# Patient Record
Sex: Female | Born: 1945 | Race: Black or African American | Hispanic: No | Marital: Married | Smoking: Former smoker
Health system: Southern US, Community
[De-identification: ages and names within clinical notes are randomized; demographics above are authoritative.]

## PROBLEM LIST (undated history)

## (undated) DIAGNOSIS — I2699 Other pulmonary embolism without acute cor pulmonale: Secondary | ICD-10-CM

## (undated) DIAGNOSIS — I10 Essential (primary) hypertension: Secondary | ICD-10-CM

## (undated) DIAGNOSIS — M6740 Ganglion, unspecified site: Secondary | ICD-10-CM

## (undated) DIAGNOSIS — E785 Hyperlipidemia, unspecified: Secondary | ICD-10-CM

## (undated) DIAGNOSIS — M1990 Unspecified osteoarthritis, unspecified site: Secondary | ICD-10-CM

## (undated) DIAGNOSIS — C801 Malignant (primary) neoplasm, unspecified: Secondary | ICD-10-CM

## (undated) HISTORY — PX: TUBAL LIGATION: SHX77

## (undated) HISTORY — DX: Unspecified osteoarthritis, unspecified site: M19.90

## (undated) HISTORY — DX: Hyperlipidemia, unspecified: E78.5

## (undated) HISTORY — DX: Essential (primary) hypertension: I10

## (undated) HISTORY — PX: TONSILLECTOMY: SUR1361

---

## 2004-08-21 DIAGNOSIS — I2699 Other pulmonary embolism without acute cor pulmonale: Secondary | ICD-10-CM

## 2004-08-21 HISTORY — DX: Other pulmonary embolism without acute cor pulmonale: I26.99

## 2005-06-29 ENCOUNTER — Inpatient Hospital Stay (HOSPITAL_COMMUNITY): Admission: EM | Admit: 2005-06-29 | Discharge: 2005-07-05 | Payer: Self-pay | Admitting: Emergency Medicine

## 2006-02-05 ENCOUNTER — Ambulatory Visit (HOSPITAL_COMMUNITY): Admission: RE | Admit: 2006-02-05 | Discharge: 2006-02-05 | Payer: Self-pay | Admitting: *Deleted

## 2006-02-05 ENCOUNTER — Encounter (INDEPENDENT_AMBULATORY_CARE_PROVIDER_SITE_OTHER): Payer: Self-pay | Admitting: *Deleted

## 2008-11-26 ENCOUNTER — Other Ambulatory Visit: Admission: RE | Admit: 2008-11-26 | Discharge: 2008-11-26 | Payer: Self-pay | Admitting: Internal Medicine

## 2011-01-06 NOTE — Op Note (Signed)
NAMEMarland Nunez  [name], [name]                ACCOUNT NO.:  [id number]   MEDICAL RECORD NO.:  [id number]          PATIENT TYPE:  AMB   LOCATION:  ENDO                         FACILITY:  [facility]   PHYSICIAN:  Georgiana Spinner, M.D.    DATE OF BIRTH:  April 03, 1946   DATE OF PROCEDURE:  02/05/2006  DATE OF DISCHARGE:                                 OPERATIVE REPORT   PROCEDURE:  Colonoscopy.   ENDOSCOPIST:  Georgiana Spinner, M.D.   INDICATIONS:  Colon polyp.   ANESTHESIA:  Fentanyl 25 mcg, Versed 2 mg.   DESCRIPTION OF PROCEDURE:  With the patient mildly sedated in the left  lateral decubitus position, the Olympus videoscopic colonoscope was inserted  into the rectum and passed under direct vision to cecum identified by crow's  foot of the cecum and ileocecal valve both, of which were photographed.  From this point, the colonoscope was slowly withdrawn taking circumferential  views of the colonic mucosa stopping in the descending colon where a polyp  was seen, photographed and removed using snare cautery technique with  setting of 20/200 blended current.  We next stopped in the rectum which  appeared normal indirect and showed a polyp on retroflexed view that was  removed using hot biopsy forceps technique, setting again of 20/220 current.  The endoscope was straightened and withdrawn.  The patient's vital signs and  pulse oximeter remained stable.  The patient tolerated the procedure well  without apparent complications.   FINDINGS:  Hemorrhoids and two small polyps as described in the descending  colon and rectum.   PLAN:  Await biopsy reports.  The patient will call me for results and  follow-up with me as an outpatient.           ______________________________  Georgiana Spinner, M.D.     GMO/MEDQ  D:  02/05/2006  T:  02/06/2006  Job:  [id number]

## 2011-01-06 NOTE — H&P (Signed)
NAMEMarland Nunez  [name], Deborah Nunez                ACCOUNT NO.:  [id number]   MEDICAL RECORD NO.:  [id number]          PATIENT TYPE:  EMS   LOCATION:  MAJO                         FACILITY:  [facility]   PHYSICIAN:  Lonia Blood, M.D.DATE OF BIRTH:  10-28-1945   DATE OF ADMISSION:  06/29/2005  DATE OF DISCHARGE:                                HISTORY & PHYSICAL   CHIEF COMPLAINT:  Chest pain with shortness of breath.   HISTORY OF PRESENT ILLNESS:  [name] is a 65 year old female with  relatively benign past medical history.  She is a very active individual.  She has been on Premarin for many years for postmenopausal symptom  management.  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  While there, she went on multiple  walks.  It was then that she began to first experience pain in her chest  accompanied by some reflux symptoms.  She treated her symptoms with  Mylanta/over-the-counter antacids and did not think more of them.  She then  returned to town.  Since that time, she has exercised many times including  walking on a treadmill in the gym.  She has noted significant dyspnea on  exertion to which she is not accustomed.  She also noticed that she became  fatigued much earlier when trying to exercise.  She then began to experience  recurrence of her chest symptoms including tightness as well as a sharp pain  located in the mid-central chest and then also in the periphery.  As  symptoms began to worsen, she became more concerned.  The morning of this  admission, she awoke to experience the pain.  This time the pain did not go  away and continued throughout the day.  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  The patient completed her full day of work and then  presented to the ER for evaluation.   In the emergency room, the patient had a  D-dimer assessed which was noted to  be positive.  She then underwent a CT scan of the chest which revealed  multiple bilateral small pulmonary emboli.   At the time of my admission, I find a patient who is sleeping comfortably on  the hospital gurney in the emergency room.  She presently has no complaints  of chest pain or shortness of breath.  There are no palpitations.  She has  had no difficulty with swelling in her legs whatsoever and no cramping of  the calves or thighs.   REVIEW OF SYSTEMS:  Comprehensive review of systems is unremarkable with the  exception of history of present illness noted above.   PAST MEDICAL HISTORY:  1.  Status post 2 uneventful vaginal deliveries.  2.  Status post bilateral tubal ligation.   OUTPATIENT MEDICATIONS:  1.  Premarin 0.625 mg p.o. daily for multiple years.  2.  Zyrtec 10 mg p.o. daily.   ALLERGIES:  No known drug allergies.  FAMILY HISTORY:  The patient's mother is alive, but has a history of  coronary artery disease, late-onset CVA and a possible history of a blood  clot in the past.  The patient's father passed away from lung cancer.  The  patient has a son who has thyroid cancer in his 30s, but no significant  family history of multiple blood clots to her knowledge.   SOCIAL HISTORY:  The patient is married.  She occasionally partakes of  alcohol.  She does not use illicit drugs.  She does not smoke.  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  She has 2  children.   DATA REVIEW:  White count is elevated at 11.9, platelets are 408,000,  hemoglobin is 13.7, MCV is 99.  Electrolytes are balanced.  BUN is 7.  Creatinine is 0.8.  LFTs are normal.  Lipase is normal.  D-dimer is elevated  at 1.08.  Cardiac point-of-care markers are negative.   Chest x-ray reveals no acute disease.   Twelve-lead EKG reveals 81 beats per minute, but is normal sinus rhythm and  has no acute ST or T wave change.   CT  scan reveals multiple bilateral small pulmonary emboli.   PHYSICAL EXAMINATION:  VITAL SIGNS:  Temperature -- afebrile, blood pressure  120/70, heart rate 72, respiratory rate 18, SATS 90% or greater on 2-L nasal  cannula.  GENERAL:  Well-developed, well-nourished female in no acute respiratory  distress.  LUNGS:  Clear to auscultation bilaterally without wheezes or rhonchi.  CARDIOVASCULAR:  Regular rate and rhythm without murmur, gallop or rub with  normal S1 and S2.  ABDOMEN:  Nontender, non-distended, soft.  Bowel sounds present.  No  hepatosplenomegaly.  No rebound.  No ascites.  EXTREMITIES:  No significant cyanosis, clubbing or edema in bilateral lower  extremities.  Negative Homans sign bilaterally.  No significant focal  erythema and no cords.  NEUROLOGIC:  Nonfocal neurologic exam.  NECK:  No JVD.  No lymphadenopathy.  No thyromegaly.   IMPRESSION AND PLAN:  1.  Multiple bilateral pulmonary emboli -- the patient is being placed on      heparin as I dictate this transcription.  Coumadin will be initiated      this evening.  The patient will be committed to a 26-month course for her      bilateral pulmonary emboli.  She understands that she will be committed      to intravenous heparin until Coumadin is therapeutic for 36-48 hours.      As for predisposing issues/risk factors, the only standout is the      Premarin dosing itself.  The patient does not smoke.  She did take a 4-      1/2 hour car ride just prior to the onset of her symptoms and this issue      could further have predisposed her to deep venous thrombosis formation.      I do not feel that a genetic cause is very likely in this patient, given      her age and relative lack of other involved family members.  I have      chosen not to assess laboratories at this time, but it certainly would      be prudent to do a complete workup at such time that she has completed     her anticoagulation therapy.  I have advised the  patient that I feel      hormone  replacement therapy is the most likely culprit predisposing her      to this clot and I have advised her that she should discontinue its use      lifelong.  2.  Hormone replacement therapy -- as detailed above, I have discussed the      risk of clot formation on hormone replacement therapy with the patient      and have advised her that she should discontinue its use immediately and      not consider using it again.      Lonia Blood, M.D.  Electronically Signed    JTM/MEDQ  D:  06/29/2005  T:  06/29/2005  Job:  [id number]

## 2011-01-06 NOTE — Op Note (Signed)
NAMEMarland Nunez  [name], [name]                ACCOUNT NO.:  [id number]   MEDICAL RECORD NO.:  [id number]          PATIENT TYPE:  AMB   LOCATION:  ENDO                         FACILITY:  [facility]   PHYSICIAN:  Georgiana Spinner, M.D.    DATE OF BIRTH:  1946-03-04   DATE OF PROCEDURE:  02/05/2006  DATE OF DISCHARGE:                                 OPERATIVE REPORT   PROCEDURE:  Upper endoscopy.   INDICATIONS:  GERD.   ANESTHESIA:  Fentanyl 50 mcg, Versed 5 mg.   PROCEDURE:  With the patient mildly sedated in left lateral decubitus  position, the Olympus videoscopic endoscope was inserted into the mouth,  passed under direct vision through the esophagus which appeared normal.  There was no evidence of Barrett's esophagus.  We entered into the stomach,  fundus, body, and antrum.  Duodenal bulb, second portion of the duodenum  appeared normal.  From this point, the endoscope was slowly withdrawn,  taking circumferential views of the duodenal mucosa until the endoscope had  been pulled back into the stomach, placed in retroflexion to view the  stomach from below.  Endoscope was straightened and withdrawn, taking  circumferential views of the remaining gastric and esophageal mucosa.  The  patient's vital signs and pulse oximetry remained stable.  The patient  tolerated the procedure well without apparent complications.   FINDINGS:  Unremarkable examination.   PLAN:  Proceed to colonoscopy.           ______________________________  Georgiana Spinner, M.D.     GMO/MEDQ  D:  02/05/2006  T:  02/06/2006  Job:  [id number]

## 2011-01-06 NOTE — Discharge Summary (Signed)
NAMEMarland Kitchen  Deborah Nunez, Deborah Nunez                ACCOUNT NO.:  [id number]   MEDICAL RECORD NO.:  [id number]          PATIENT TYPE:  INP   LOCATION:  3714                         FACILITY:  [facility]   PHYSICIAN:  Danae Chen, M.D.DATE OF BIRTH:  [birth year]   DATE OF ADMISSION:  06/28/2005  DATE OF DISCHARGE:  07/05/2005                                 DISCHARGE SUMMARY   PRIMARY CARE PHYSICIAN:  [facility] on [address].  She will  be following up with Dr. Lucas Mallow at [facility] as well.   DISCHARGE DIAGNOSIS:  Bilateral pulmonary emboli.   DISCHARGE MEDICATIONS:  1.  Coumadin 6 mg p.o. every day.  2.  The patient is to discontinue use of her Prempro hormonal supplement.   Follow up in one week's time to have a PT INR checked either at [facility] or at [facility].   No consults.   PROCEDURES:  1.  Spiral CT showing bilateral pulmonary emboli.  2.  Lower extremity venous Doppler showing no evidence of DVT, SVT, or Baker      cyst in either the left or right lower extremities.   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  Evaluation revealed  that she did have bilateral pulmonary emboli.  She had recently been on a  prolonged trip and had also been on hormone replacement therapy which could  have been factors contributing to the formation of her pulmonary emboli.  She was admitted, started on IV heparin as well as oral Coumadin.  By  hospital day #4, she was therapeutic on oral Coumadin.  She had a 12 to 24-  hour overlap of heparin plus her Coumadin prior to her discharge.  At the  time of discharge her INR is 2.2.   DISCHARGE PHYSICAL EXAMINATION:  VITAL SIGNS:  She is afebrile, vital signs  are stable.  LUNGS:  Clear.  HEART:  Rate is regular.  ABDOMEN:  Soft.  EXTREMITIES:  She has no peripheral edema.  NEUROLOGIC:  She is  alert and oriented.   CONDITION ON DISCHARGE:  Improved.   FOLLOWUP:  As above.   The patient may return to work on July 10, 2005, and as noted she is to  discontinue use of her hormone replacement therapy.      Danae Chen, M.D.  Electronically Signed     RLK/MEDQ  D:  07/05/2005  T:  07/05/2005  Job:  [id number]

## 2013-02-12 ENCOUNTER — Other Ambulatory Visit: Payer: Self-pay | Admitting: Internal Medicine

## 2013-02-25 ENCOUNTER — Ambulatory Visit
Admission: RE | Admit: 2013-02-25 | Discharge: 2013-02-25 | Disposition: A | Discharge status: Home / Self Care | Payer: BC Managed Care – PPO | Source: Ambulatory Visit | Attending: Internal Medicine | Admitting: Internal Medicine

## 2013-03-18 ENCOUNTER — Other Ambulatory Visit: Payer: Self-pay | Admitting: Gastroenterology

## 2014-05-21 ENCOUNTER — Other Ambulatory Visit: Payer: Self-pay | Admitting: Internal Medicine

## 2014-05-21 DIAGNOSIS — R591 Generalized enlarged lymph nodes: Secondary | ICD-10-CM

## 2014-05-27 ENCOUNTER — Ambulatory Visit
Admission: RE | Admit: 2014-05-27 | Discharge: 2014-05-27 | Disposition: A | Discharge status: Home / Self Care | Payer: Medicare Other | Source: Ambulatory Visit | Attending: Internal Medicine | Admitting: Internal Medicine

## 2014-05-27 DIAGNOSIS — R591 Generalized enlarged lymph nodes: Secondary | ICD-10-CM

## 2014-05-27 MED ORDER — IOHEXOL 300 MG/ML  SOLN
75.0000 mL | Freq: Once | INTRAMUSCULAR | Status: AC | PRN
  Administered 2014-05-27: 75 mL via INTRAVENOUS

## 2014-06-01 ENCOUNTER — Other Ambulatory Visit: Payer: Self-pay | Admitting: Internal Medicine

## 2014-06-01 DIAGNOSIS — D490 Neoplasm of unspecified behavior of digestive system: Secondary | ICD-10-CM

## 2014-06-01 DIAGNOSIS — R9389 Abnormal findings on diagnostic imaging of other specified body structures: Secondary | ICD-10-CM

## 2014-06-11 ENCOUNTER — Ambulatory Visit
Admission: RE | Admit: 2014-06-11 | Discharge: 2014-06-11 | Disposition: A | Discharge status: Home / Self Care | Payer: Medicare Other | Source: Ambulatory Visit | Attending: Internal Medicine | Admitting: Internal Medicine

## 2014-06-11 DIAGNOSIS — R9389 Abnormal findings on diagnostic imaging of other specified body structures: Secondary | ICD-10-CM

## 2014-06-11 DIAGNOSIS — D490 Neoplasm of unspecified behavior of digestive system: Secondary | ICD-10-CM

## 2014-06-11 MED ORDER — GADOBENATE DIMEGLUMINE 529 MG/ML IV SOLN
18.0000 mL | Freq: Once | INTRAVENOUS | Status: AC | PRN
  Administered 2014-06-11: 18 mL via INTRAVENOUS

## 2014-06-12 ENCOUNTER — Other Ambulatory Visit: Payer: Self-pay | Admitting: Internal Medicine

## 2014-06-12 DIAGNOSIS — E041 Nontoxic single thyroid nodule: Secondary | ICD-10-CM

## 2014-06-16 ENCOUNTER — Ambulatory Visit
Admission: RE | Admit: 2014-06-16 | Discharge: 2014-06-16 | Disposition: A | Discharge status: Home / Self Care | Payer: Medicare Other | Source: Ambulatory Visit | Attending: Internal Medicine | Admitting: Internal Medicine

## 2014-06-16 DIAGNOSIS — E041 Nontoxic single thyroid nodule: Secondary | ICD-10-CM

## 2015-06-23 ENCOUNTER — Other Ambulatory Visit: Payer: Self-pay | Admitting: Orthopedic Surgery

## 2015-07-09 ENCOUNTER — Encounter (HOSPITAL_BASED_OUTPATIENT_CLINIC_OR_DEPARTMENT_OTHER): Payer: Self-pay | Admitting: *Deleted

## 2015-07-20 ENCOUNTER — Ambulatory Visit (HOSPITAL_BASED_OUTPATIENT_CLINIC_OR_DEPARTMENT_OTHER): Payer: Medicare Other | Admitting: Anesthesiology

## 2015-07-20 ENCOUNTER — Ambulatory Visit (HOSPITAL_BASED_OUTPATIENT_CLINIC_OR_DEPARTMENT_OTHER)
Admission: RE | Admit: 2015-07-20 | Discharge: 2015-07-20 | Disposition: A | Discharge status: Home / Self Care | Payer: Medicare Other | Source: Ambulatory Visit | Attending: Orthopedic Surgery | Admitting: Orthopedic Surgery

## 2015-07-20 ENCOUNTER — Encounter (HOSPITAL_BASED_OUTPATIENT_CLINIC_OR_DEPARTMENT_OTHER)
Admission: RE | Disposition: A | Discharge status: Home / Self Care | Payer: Self-pay | Source: Ambulatory Visit | Attending: Orthopedic Surgery

## 2015-07-20 ENCOUNTER — Encounter (HOSPITAL_BASED_OUTPATIENT_CLINIC_OR_DEPARTMENT_OTHER): Payer: Self-pay | Admitting: Orthopedic Surgery

## 2015-07-20 DIAGNOSIS — M67441 Ganglion, right hand: Secondary | ICD-10-CM | POA: Diagnosis not present

## 2015-07-20 DIAGNOSIS — Z87891 Personal history of nicotine dependence: Secondary | ICD-10-CM | POA: Diagnosis not present

## 2015-07-20 DIAGNOSIS — I10 Essential (primary) hypertension: Secondary | ICD-10-CM | POA: Insufficient documentation

## 2015-07-20 DIAGNOSIS — R2231 Localized swelling, mass and lump, right upper limb: Secondary | ICD-10-CM | POA: Diagnosis present

## 2015-07-20 DIAGNOSIS — Z86711 Personal history of pulmonary embolism: Secondary | ICD-10-CM | POA: Diagnosis not present

## 2015-07-20 HISTORY — DX: Other pulmonary embolism without acute cor pulmonale: I26.99

## 2015-07-20 HISTORY — DX: Ganglion, unspecified site: M67.40

## 2015-07-20 HISTORY — PX: CYST EXCISION: SHX5701

## 2015-07-20 SURGERY — CYST REMOVAL
Anesthesia: General | Site: Finger | Laterality: Right

## 2015-07-20 MED ORDER — ONDANSETRON HCL 4 MG/2ML IJ SOLN
INTRAMUSCULAR | Status: DC | PRN
  Administered 2015-07-20: 4 mg via INTRAVENOUS

## 2015-07-20 MED ORDER — CEFAZOLIN SODIUM-DEXTROSE 2-3 GM-% IV SOLR: 2.0000 g | INTRAVENOUS | Status: DC

## 2015-07-20 MED ORDER — HYDROCODONE-ACETAMINOPHEN 5-325 MG PO TABS: 1.0000 | ORAL_TABLET | Freq: Four times a day (QID) | ORAL | Status: DC | PRN

## 2015-07-20 MED ORDER — MEPERIDINE HCL 25 MG/ML IJ SOLN: 6.2500 mg | INTRAMUSCULAR | Status: DC | PRN

## 2015-07-20 MED ORDER — CHLORHEXIDINE GLUCONATE 4 % EX LIQD: 60.0000 mL | Freq: Once | CUTANEOUS | Status: DC

## 2015-07-20 MED ORDER — PROPOFOL 10 MG/ML IV BOLUS
INTRAVENOUS | Status: DC | PRN
  Administered 2015-07-20: 50 mg via INTRAVENOUS
  Administered 2015-07-20: 200 mg via INTRAVENOUS

## 2015-07-20 MED ORDER — DEXAMETHASONE SODIUM PHOSPHATE 4 MG/ML IJ SOLN
INTRAMUSCULAR | Status: DC | PRN
  Administered 2015-07-20: 10 mg via INTRAVENOUS

## 2015-07-20 MED ORDER — MIDAZOLAM HCL 2 MG/2ML IJ SOLN
1.0000 mg | INTRAMUSCULAR | Status: DC | PRN
  Administered 2015-07-20: 2 mg via INTRAVENOUS

## 2015-07-20 MED ORDER — LIDOCAINE HCL (CARDIAC) 20 MG/ML IV SOLN
INTRAVENOUS | Status: AC
  Filled 2015-07-20: qty 5

## 2015-07-20 MED ORDER — LIDOCAINE HCL (CARDIAC) 20 MG/ML IV SOLN
INTRAVENOUS | Status: DC | PRN
  Administered 2015-07-20: 100 mg via INTRAVENOUS

## 2015-07-20 MED ORDER — FENTANYL CITRATE (PF) 100 MCG/2ML IJ SOLN
50.0000 ug | INTRAMUSCULAR | Status: DC | PRN
  Administered 2015-07-20: 100 ug via INTRAVENOUS

## 2015-07-20 MED ORDER — LACTATED RINGERS IV SOLN
INTRAVENOUS | Status: DC
  Administered 2015-07-20: 09:00:00 via INTRAVENOUS

## 2015-07-20 MED ORDER — PROPOFOL 10 MG/ML IV BOLUS
INTRAVENOUS | Status: AC
  Filled 2015-07-20: qty 20

## 2015-07-20 MED ORDER — 0.9 % SODIUM CHLORIDE (POUR BTL) OPTIME
TOPICAL | Status: DC | PRN
  Administered 2015-07-20: 100 mL

## 2015-07-20 MED ORDER — FENTANYL CITRATE (PF) 100 MCG/2ML IJ SOLN
INTRAMUSCULAR | Status: AC
  Filled 2015-07-20: qty 2

## 2015-07-20 MED ORDER — MIDAZOLAM HCL 2 MG/2ML IJ SOLN
INTRAMUSCULAR | Status: AC
  Filled 2015-07-20: qty 2

## 2015-07-20 MED ORDER — SUCCINYLCHOLINE CHLORIDE 20 MG/ML IJ SOLN
INTRAMUSCULAR | Status: AC
  Filled 2015-07-20: qty 1

## 2015-07-20 MED ORDER — ONDANSETRON HCL 4 MG/2ML IJ SOLN
INTRAMUSCULAR | Status: AC
  Filled 2015-07-20: qty 2

## 2015-07-20 MED ORDER — GLYCOPYRROLATE 0.2 MG/ML IJ SOLN: 0.2000 mg | Freq: Once | INTRAMUSCULAR | Status: DC | PRN

## 2015-07-20 MED ORDER — CEFAZOLIN SODIUM-DEXTROSE 2-3 GM-% IV SOLR
2.0000 g | INTRAVENOUS | Status: AC
  Administered 2015-07-20: 2 g via INTRAVENOUS

## 2015-07-20 MED ORDER — SCOPOLAMINE 1 MG/3DAYS TD PT72: 1.0000 | MEDICATED_PATCH | Freq: Once | TRANSDERMAL | Status: DC | PRN

## 2015-07-20 MED ORDER — FENTANYL CITRATE (PF) 100 MCG/2ML IJ SOLN: 25.0000 ug | INTRAMUSCULAR | Status: DC | PRN

## 2015-07-20 MED ORDER — PHENYLEPHRINE HCL 10 MG/ML IJ SOLN
INTRAMUSCULAR | Status: DC | PRN
  Administered 2015-07-20: 40 ug via INTRAVENOUS

## 2015-07-20 MED ORDER — BUPIVACAINE HCL (PF) 0.25 % IJ SOLN
INTRAMUSCULAR | Status: DC | PRN
  Administered 2015-07-20: 6 mL

## 2015-07-20 MED ORDER — DEXAMETHASONE SODIUM PHOSPHATE 10 MG/ML IJ SOLN
INTRAMUSCULAR | Status: AC
  Filled 2015-07-20: qty 1

## 2015-07-20 SURGICAL SUPPLY — 33 items
BLADE SURG 15 STRL LF DISP TIS (BLADE) ×1 IMPLANT
BLADE SURG 15 STRL SS (BLADE) ×3
BNDG CMPR 9X4 STRL LF SNTH (GAUZE/BANDAGES/DRESSINGS) ×1
BNDG COHESIVE 1X5 TAN STRL LF (GAUZE/BANDAGES/DRESSINGS) ×2 IMPLANT
BNDG ESMARK 4X9 LF (GAUZE/BANDAGES/DRESSINGS) ×2 IMPLANT
CHLORAPREP W/TINT 26ML (MISCELLANEOUS) ×3 IMPLANT
CORDS BIPOLAR (ELECTRODE) ×3 IMPLANT
COVER BACK TABLE 60X90IN (DRAPES) ×3 IMPLANT
COVER MAYO STAND STRL (DRAPES) ×3 IMPLANT
CUFF TOURNIQUET SINGLE 18IN (TOURNIQUET CUFF) ×2 IMPLANT
DRAPE EXTREMITY T 121X128X90 (DRAPE) ×3 IMPLANT
DRAPE SURG 17X23 STRL (DRAPES) ×3 IMPLANT
GAUZE SPONGE 4X4 12PLY STRL (GAUZE/BANDAGES/DRESSINGS) ×3 IMPLANT
GAUZE XEROFORM 1X8 LF (GAUZE/BANDAGES/DRESSINGS) ×3 IMPLANT
GLOVE BIOGEL PI IND STRL 7.0 (GLOVE) IMPLANT
GLOVE BIOGEL PI IND STRL 8.5 (GLOVE) ×1 IMPLANT
GLOVE BIOGEL PI INDICATOR 7.0 (GLOVE) ×4
GLOVE BIOGEL PI INDICATOR 8.5 (GLOVE) ×2
GLOVE ECLIPSE 6.5 STRL STRAW (GLOVE) ×2 IMPLANT
GLOVE SURG ORTHO 8.0 STRL STRW (GLOVE) ×3 IMPLANT
GOWN STRL REUS W/ TWL LRG LVL3 (GOWN DISPOSABLE) ×1 IMPLANT
GOWN STRL REUS W/TWL LRG LVL3 (GOWN DISPOSABLE) ×3
GOWN STRL REUS W/TWL XL LVL3 (GOWN DISPOSABLE) ×3 IMPLANT
NDL PRECISIONGLIDE 27X1.5 (NEEDLE) IMPLANT
NEEDLE PRECISIONGLIDE 27X1.5 (NEEDLE) ×3 IMPLANT
NS IRRIG 1000ML POUR BTL (IV SOLUTION) ×3 IMPLANT
PACK BASIN DAY SURGERY FS (CUSTOM PROCEDURE TRAY) ×3 IMPLANT
SPLINT FINGER 3.25 BULB 911905 (SOFTGOODS) ×2 IMPLANT
STOCKINETTE 4X48 STRL (DRAPES) ×3 IMPLANT
SUT ETHILON 4 0 PS 2 18 (SUTURE) ×3 IMPLANT
SYR BULB 3OZ (MISCELLANEOUS) ×3 IMPLANT
SYR CONTROL 10ML LL (SYRINGE) ×2 IMPLANT
TOWEL OR 17X24 6PK STRL BLUE (TOWEL DISPOSABLE) ×4 IMPLANT

## 2015-07-20 NOTE — Op Note (Signed)
Dictation Number [id number]

## 2015-07-20 NOTE — Discharge Instructions (Addendum)

## 2015-07-20 NOTE — Brief Op Note (Signed)
07/20/2015  10:48 AM  PATIENT:  Deborah Nunez  69 y.o. female  PRE-OPERATIVE DIAGNOSIS:  MUCOID TUMOR RIGHT INDEX FINGER   POST-OPERATIVE DIAGNOSIS:  MUCOID TUMOR RIGHT INDEX FINGER   PROCEDURE:  Procedure(s): EXCISION MUCOID CYST RIGHT INDEX  (Right)  SURGEON:  Surgeon(s) and Role:    * Daryll Brod, MD - Primary  PHYSICIAN ASSISTANT:   ASSISTANTS: none   ANESTHESIA:   local and general  EBL:  Total I/O In: 800 [I.V.:800] Out: -   BLOOD ADMINISTERED:none  DRAINS: none   LOCAL MEDICATIONS USED:  BUPIVICAINE   SPECIMEN:  Excision  DISPOSITION OF SPECIMEN:  PATHOLOGY  COUNTS:  YES  TOURNIQUET:   Total Tourniquet Time Documented: Forearm (Right) - 17 minutes Total: Forearm (Right) - 17 minutes   DICTATION: .Other Dictation: Dictation Number [id number]  PLAN OF CARE: Discharge to home after PACU  PATIENT DISPOSITION:  PACU - hemodynamically stable.

## 2015-07-20 NOTE — H&P (Signed)
  Deborah Nunez is a 69 year old right handed female complaining of a mass on her right index finger and right thumb. This has been there for approximately one year. It is grooving the nail distally. She is referred by Dr. Merrilee Seashore. She has no history of injury. There is no history of diabetes, thyroid problems, arthritis or gout. There is a family history of diabetes and thyroid problems. She is not complaining of any significant pain. She is complaining primarily of deformity of the nail.  PAST MEDICAL HISTORY:  She has no drug allergies. She is not currently taking any medications. She has had a tonsillectomy. She has a history of a pulmonary embolism for which she was on Coumadin from 2006 to 2008.   FAMILY MEDICAL HISTORY: Positive for high BP.  SOCIAL HISTORY:  She does not smoke. She drinks socially. She is widowed and a retired Pharmacist, hospital.  REVIEW OF SYSTEMS: Positive for glasses, blood in her urine, kidney disorder, otherwise negative 14 points.  Deborah Nunez is an 69 y.o. female.   Chief Complaint: DJD and cyst right index finger HPI: see above  Past Medical History  Diagnosis Date  . Pulmonary embolism ([place]) 2006  . Mucoid cyst of joint     right index    Past Surgical History  Procedure Laterality Date  . Tubal ligation    . Tonsillectomy      History reviewed. No pertinent family history. Social History:  reports that she has quit smoking. She does not have any smokeless tobacco history on file. She reports that she drinks alcohol. She reports that she does not use illicit drugs.  Allergies: No Known Allergies  No prescriptions prior to admission    No results found for this or any previous visit (from the past 48 hour(s)).  No results found.   Pertinent items are noted in HPI.  Height 5\' 6"  (1.676 m), weight 86.183 kg (190 lb).  General appearance: alert, cooperative and appears stated age Head: Normocephalic, without obvious abnormality Neck: no  JVD Resp: clear to auscultation bilaterally Cardio: regular rate and rhythm, S1, S2 normal, no murmur, click, rub or gallop GI: soft, non-tender; bowel sounds normal; no masses,  no organomegaly Extremities: djd right index finger DIP joint Pulses: 2+ and symmetric Skin: Skin color, texture, turgor normal. 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  We have discussed the possibility of surgical excision along with debridement of the joint. Pre, peri and post op care are discussed along with risks and complications. Patient is aware there is no guarantee with surgery, possibility of infection, injury to arteries, nerves, and tendons, incomplete relief and dystrophy. She desires proceeding and this is scheduled as an outpatient for excision mucoid tumor debridement DIP joint right index finger under regional anesthesia.  Deborah Nunez 07/20/2015, 5:25 AM

## 2015-07-20 NOTE — Anesthesia Postprocedure Evaluation (Signed)
Anesthesia Post Note  Patient: Hollis Burruel  Procedure(s) Performed: Procedure(s) (LRB): EXCISION MUCOID CYST RIGHT INDEX  (Right)  Patient location during evaluation: PACU Anesthesia Type: General Level of consciousness: awake and alert Pain management: pain level controlled Vital Signs Assessment: post-procedure vital signs reviewed and stable Respiratory status: spontaneous breathing, nonlabored ventilation, respiratory function stable and patient connected to nasal cannula oxygen Cardiovascular status: blood pressure returned to baseline and stable Postop Assessment: no signs of nausea or vomiting Anesthetic complications: no    Last Vitals:  Filed Vitals:   07/20/15 1030 07/20/15 1045  BP: 117/74 127/79  Pulse: 78 82  Temp: 36.4 C   Resp: 11 17    Last Pain:  Filed Vitals:   07/20/15 1048  PainSc: 0-No pain                 Anacarolina Evelyn A

## 2015-07-20 NOTE — Transfer of Care (Signed)
Immediate Anesthesia Transfer of Care Note  Patient: Deborah Nunez  Procedure(s) Performed: Procedure(s): EXCISION MUCOID CYST RIGHT INDEX  (Right)  Patient Location: PACU  Anesthesia Type:General  Level of Consciousness: awake, sedated and patient cooperative  Airway & Oxygen Therapy: Patient Spontanous Breathing and Patient connected to face mask oxygen  Post-op Assessment: Report given to RN and Post -op Vital signs reviewed and stable  Post vital signs: Reviewed and stable  Last Vitals:  Filed Vitals:   07/20/15 0903  BP: 136/83  Pulse: 82  Temp: 36.7 C  Resp: 18    Complications: No apparent anesthesia complications

## 2015-07-20 NOTE — Anesthesia Procedure Notes (Signed)
Procedure Name: LMA Insertion Date/Time: 07/20/2015 9:52 AM Performed by: Lyndee Leo Pre-anesthesia Checklist: Patient identified, Emergency Drugs available, Suction available and Patient being monitored Patient Re-evaluated:Patient Re-evaluated prior to inductionOxygen Delivery Method: Circle System Utilized Preoxygenation: Pre-oxygenation with 100% oxygen Intubation Type: IV induction Ventilation: Mask ventilation without difficulty LMA: LMA with gastric port inserted LMA Size: 3.0 Number of attempts: 1 Airway Equipment and Method: Bite block Placement Confirmation: positive ETCO2 Tube secured with: Tape Dental Injury: Teeth and Oropharynx as per pre-operative assessment

## 2015-07-20 NOTE — Anesthesia Preprocedure Evaluation (Signed)
Anesthesia Evaluation  Patient identified by MRN, date of birth, ID band Patient awake    Reviewed: Allergy & Precautions, NPO status , Patient's Chart, lab work & pertinent test results  Airway Mallampati: I  TM Distance: >3 FB Neck ROM: Full    Dental  (+) Teeth Intact, Dental Advisory Given   Pulmonary former smoker,    breath sounds clear to auscultation       Cardiovascular  Rhythm:Regular Rate:Normal     Neuro/Psych    GI/Hepatic   Endo/Other  Morbid obesity  Renal/GU      Musculoskeletal   Abdominal   Peds  Hematology   Anesthesia Other Findings   Reproductive/Obstetrics                             Anesthesia Physical Anesthesia Plan  ASA: I  Anesthesia Plan: General   Post-op Pain Management:    Induction: Intravenous  Airway Management Planned: LMA  Additional Equipment:   Intra-op Plan:   Post-operative Plan: Extubation in OR  Informed Consent: I have reviewed the patients History and Physical, chart, labs and discussed the procedure including the risks, benefits and alternatives for the proposed anesthesia with the patient or authorized representative who has indicated his/her understanding and acceptance.   Dental advisory given  Plan Discussed with: CRNA, Anesthesiologist and Surgeon  Anesthesia Plan Comments:         Anesthesia Quick Evaluation

## 2015-07-21 ENCOUNTER — Encounter (HOSPITAL_BASED_OUTPATIENT_CLINIC_OR_DEPARTMENT_OTHER): Payer: Self-pay | Admitting: Orthopedic Surgery

## 2015-07-21 NOTE — Op Note (Signed)
NAMEMarland Kitchen  Deborah Nunez, Deborah Nunez                ACCOUNT NO.:  [id number]  MEDICAL RECORD NO.:  [id number]  LOCATION:                                 FACILITY:  PHYSICIAN:  Daryll Brod, M.D.            DATE OF BIRTH:  DATE OF PROCEDURE:  07/20/2015 DATE OF DISCHARGE:                              OPERATIVE REPORT   PREOPERATIVE DIAGNOSES:  Mucoid tumor, distal interphalangeal joint; degenerative arthritis, right index finger.  POSTOPERATIVE DIAGNOSES:  Mucoid tumor, distal interphalangeal joint; degenerative arthritis, right index finger.  OPERATION:  Excision of mucoid cyst; debridement of distal interphalangeal joint, right index finger.  SURGEON:  Daryll Brod, M.D.  ANESTHESIA:  General with metacarpal block.  ANESTHESIOLOGIST:  Lorrene Reid, M.D.  HISTORY:  The patient is a 69 year old female with history of a mass grooving her nail plate distally of her right index finger.  X-rays revealed degenerative arthritis of the distal interphalangeal joint. She is elected to undergo surgical excision with debridement of the joint, in that the mass does transilluminate.  She is advised that there was no guarantee with the surgery; possibility of infection; recurrence of injury to arteries, nerves, tendons; incomplete relief of symptoms; dystrophy.  In the preoperative area, the patient was seen, the extremity marked by both patient and surgeon, and antibiotic given.  DESCRIPTION OF PROCEDURE:  The patient was brought to the operating room, where a general anesthetic was carried out without difficulty. She was prepped using ChloraPrep, supine position with the right arm free.  Time-out was taken.  The finger was blocked with 0.25% bupivacaine without epinephrine, approximately 6 mL was used.  A forearm IV regional had been also given along with her general anesthetic.  A curvilinear incision was made over the distal interphalangeal joint, carried down through the subcutaneous tissue.  Bleeders  were electrocauterized with bipolar.  The extensor tendon was noted.  The skin was lifted distally and the cyst immediately encountered.  With blunt and sharp dissection, this was dissected free, excised with a hemostatic rongeur and sent to Pathology.  The joint was opened laterally to the extensor tendon.  A synovectomy was performed dorsally along with debridement of osteophytes with the hemostatic rongeur.  This was all sent to Pathology.  The wound was copiously irrigated with saline and the skin was then closed with interrupted 4-0 nylon sutures. A sterile compressive dressing splint to the distal interphalangeal joint was applied.  On deflation of the tourniquet, all fingers were immediately pinked.  She was taken to the recovery room for observation in satisfactory condition.  She will be discharged to home to return to the [place] in 1 week, on [place].          ______________________________ Daryll Brod, M.D.     GK/MEDQ  D:  07/20/2015  T:  07/21/2015  Job:  [id number]

## 2016-03-22 IMAGING — CT CT NECK W/ CM
4 of 5 series · 16 of 33 positions shown, 19 images · IV contrast (75CC OMNI 300)
Comparison: No prior.

CLINICAL DATA: Lymphadenopathy.  Hoarseness.  Initial evaluation.

EXAM:
CT NECK WITH CONTRAST
TECHNIQUE: Multidetector CT imaging of the neck was performed using the
standard protocol following the bolus administration of intravenous
contrast.
CONTRAST:  75mL OMNIPAQUE IOHEXOL 300 MG/ML  SOLN

[Series 2: axial neck · axial · 0.43mm/px · z∈[+64,+156]mm · 3 of 93 slices shown]
[im 19/93  bone]
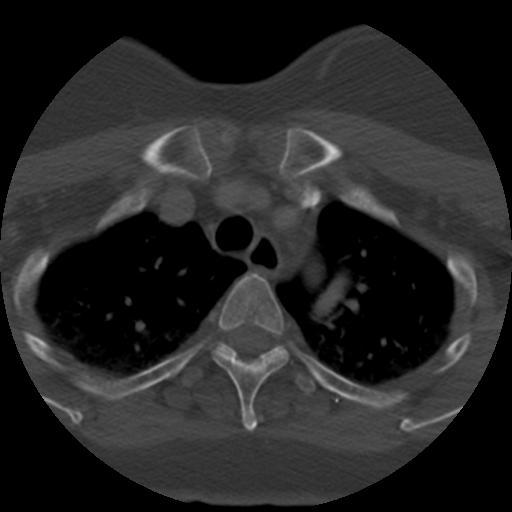
[im 37/93  bone]
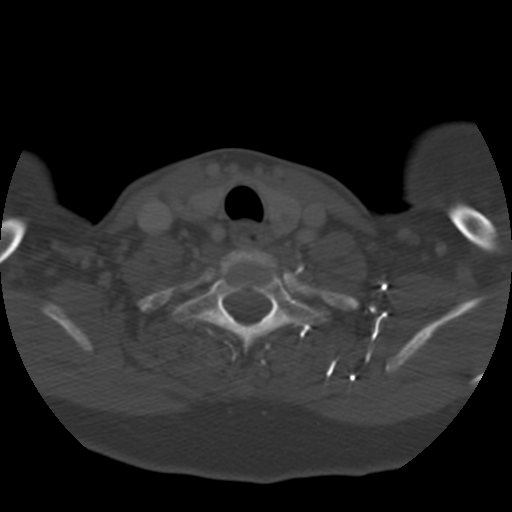
[im 56/93  bone]
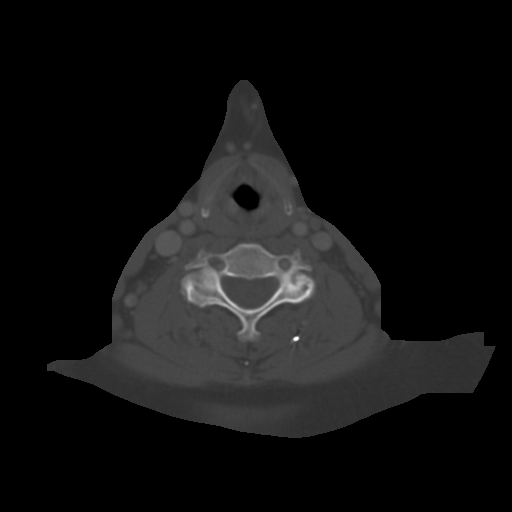

[Series 400: cor · coronal · 0.46mm/px · 3 of 109 slices shown]
[im 22/109  bone]
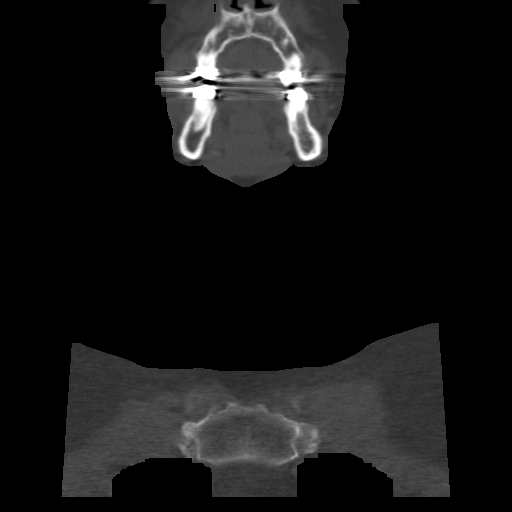
[im 44/109  bone]
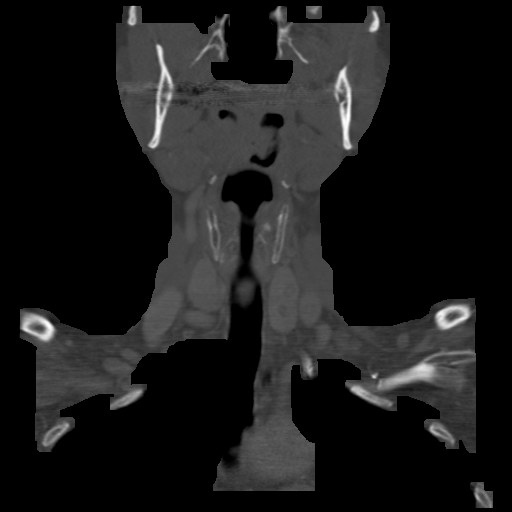
[im 65/109  bone]
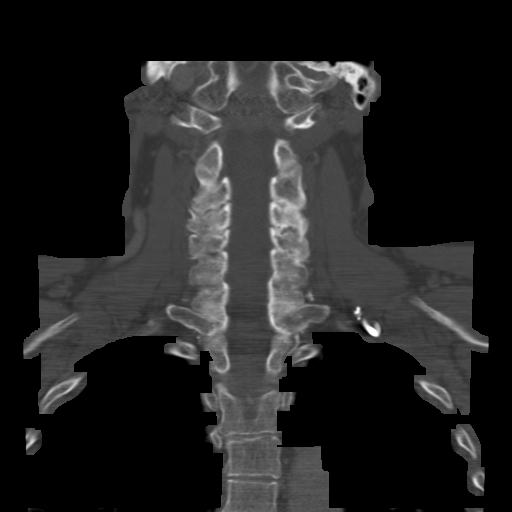

[Series 401: sag · sagittal · 0.46mm/px · 5 of 109 slices shown, 6 images]
[im 37/109  bone]
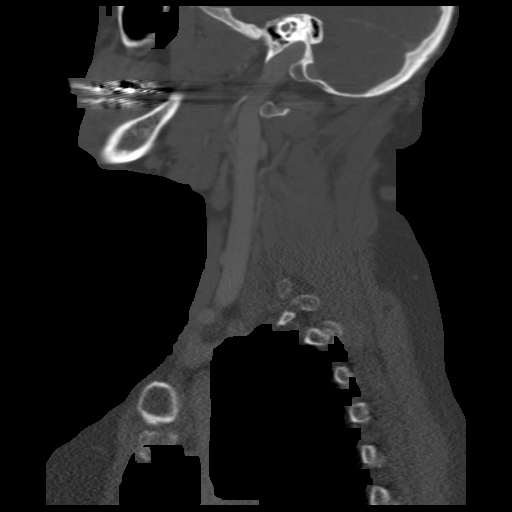
[im 46/109  bone]
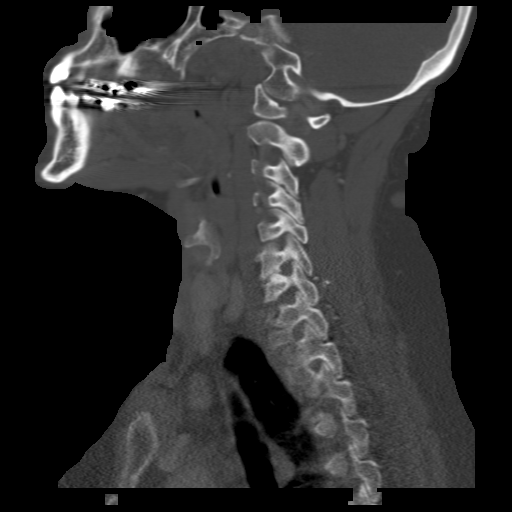
[im 55/109  soft-tissue]
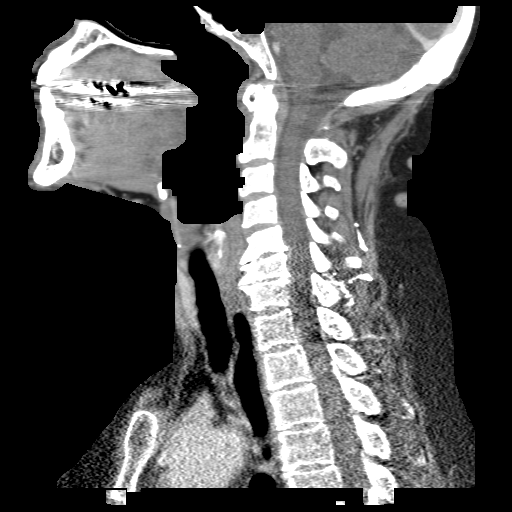
[im 55/109  bone]
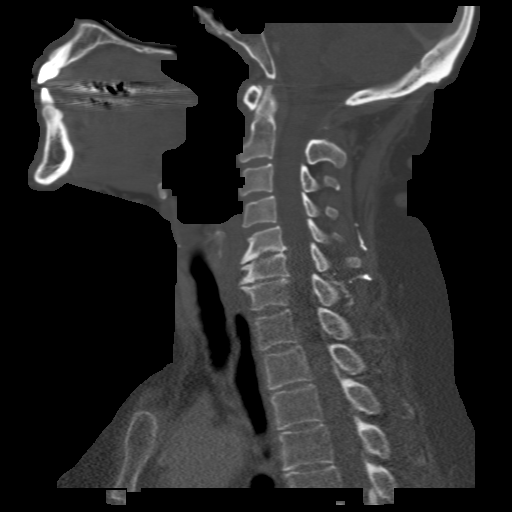
[im 64/109  bone]
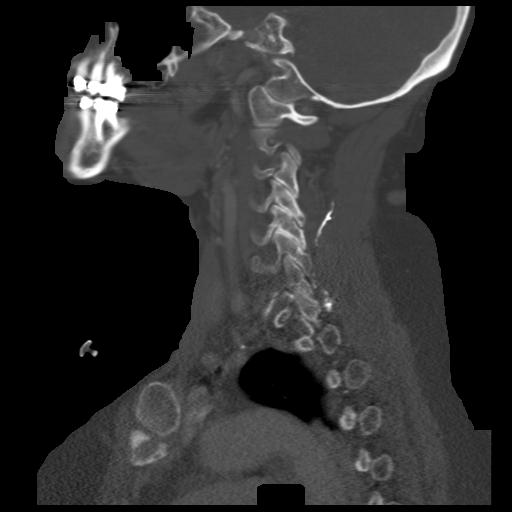
[im 73/109  bone]
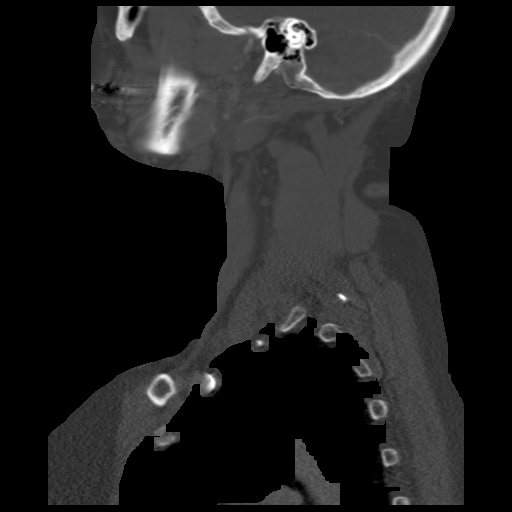

[Series 402: axial · axial · 0.43mm/px · z∈[+35,+194]mm · 5 of 123 slices shown, 7 images]
[im 21/123  soft-tissue]
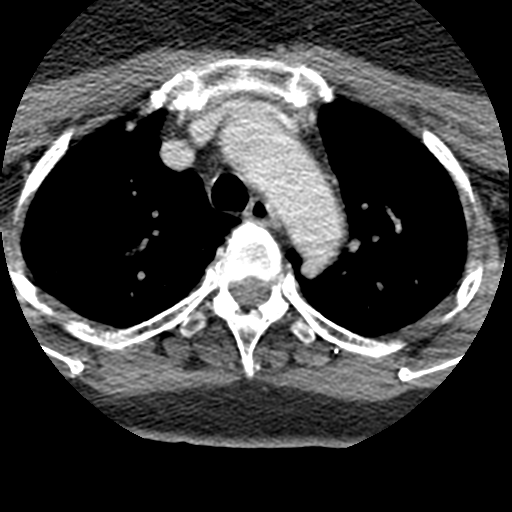
[im 21/123  bone]
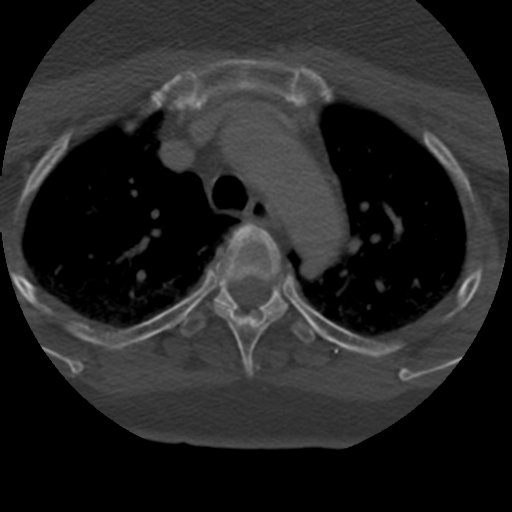
[im 41/123  bone]
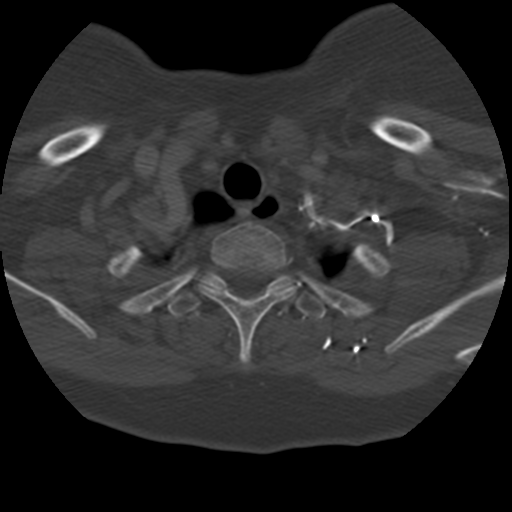
[im 62/123  bone]
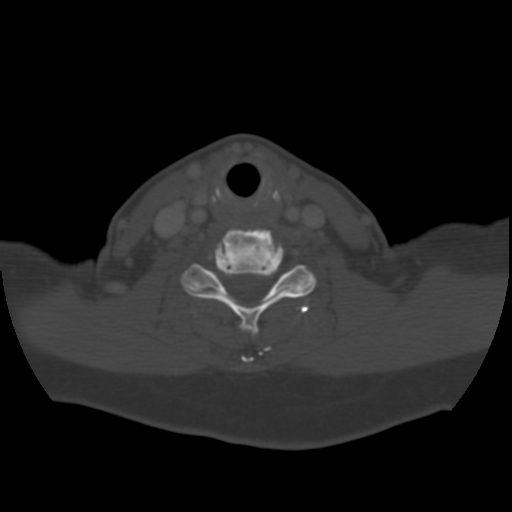
[im 82/123  bone]
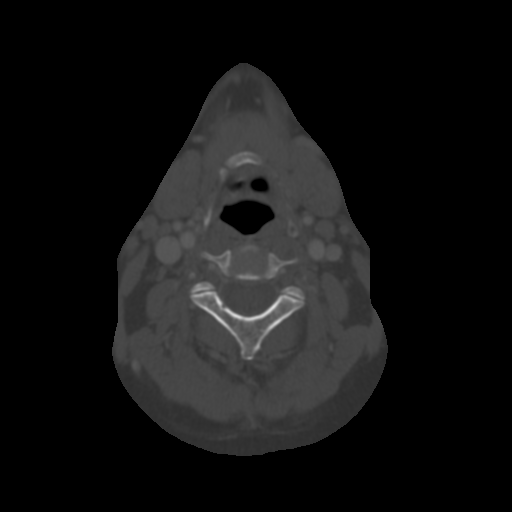
[im 102/123  soft-tissue]
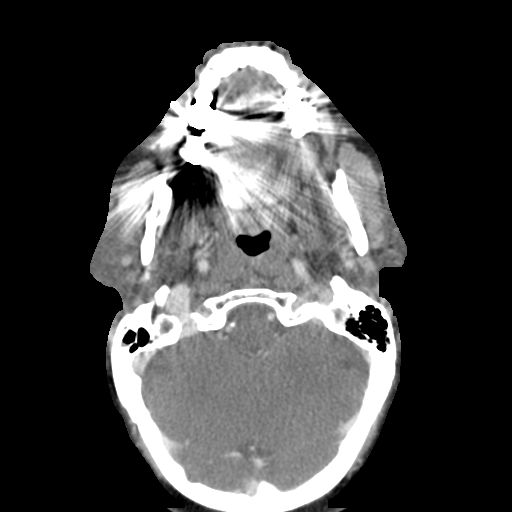
[im 102/123  bone]
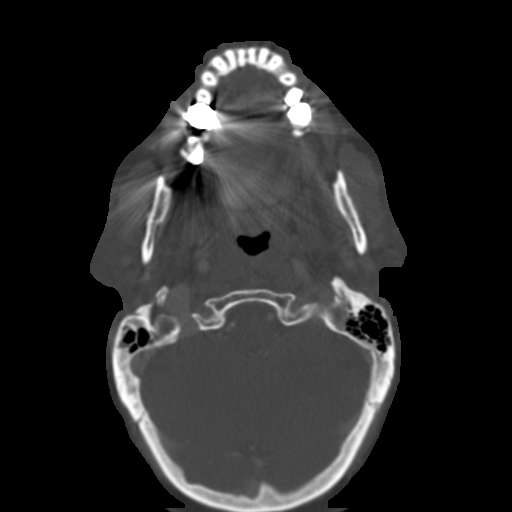

[16 of 33 positions shown; findings below may reference images not displayed]

FINDINGS: Visualized paranasal sinuses and mastoids are clear. Base of the
brain is unremarkable. Nasopharynx and oropharynx normal. Larynx
appears normal. Trachea is patent. 1.3 cm enhancing nodule in the
inferior tip of the right parotid gland. Cannot be certain if this
is vascular or true parotid lesion such as parotid tumor. To further
evaluate MRI is suggested. Submandibular glands are normal. Shotty
submandibular lymph nodes present. Bilateral shotty anterior and
posterior cervical lymph nodes are present. In the region of
palpable abnormality the largest lymph node measures 6 mm, image
number 32/series 2. No prominent cervical lymph nodes are present.
Tongue base and parapharyngeal spaces are normal. Tiny 3 mm nodules
noted both lobes of the thyroid, too small for evaluation. Upper
mediastinum is unremarkable. Vascular structures in the neck are
unremarkable. Supraclavicular regions are unremarkable. Mild passive
atelectasis upper lungs. Pulmonary apices otherwise clear. No acute
bony abnormality. Diffuse degenerative change.
IMPRESSION: 1. 1.3 cm enhancing nodule in the inferior tip of the right parotid
gland. To evaluate for parotid gland tumor gadolinium-enhanced
maxillofacial MRI suggested.

2. Shotty submandibular and bilateral anterior and posterior
cervical lymph nodes.

3.  Multinodular thyroid gland.

## 2019-11-21 DIAGNOSIS — M8589 Other specified disorders of bone density and structure, multiple sites: Secondary | ICD-10-CM | POA: Diagnosis not present

## 2019-11-21 DIAGNOSIS — Z1231 Encounter for screening mammogram for malignant neoplasm of breast: Secondary | ICD-10-CM | POA: Diagnosis not present

## 2019-12-11 DIAGNOSIS — N6311 Unspecified lump in the right breast, upper outer quadrant: Secondary | ICD-10-CM | POA: Diagnosis not present

## 2019-12-11 DIAGNOSIS — N6312 Unspecified lump in the right breast, upper inner quadrant: Secondary | ICD-10-CM | POA: Diagnosis not present

## 2019-12-11 DIAGNOSIS — R928 Other abnormal and inconclusive findings on diagnostic imaging of breast: Secondary | ICD-10-CM | POA: Diagnosis not present

## 2019-12-22 ENCOUNTER — Other Ambulatory Visit: Payer: Self-pay | Admitting: Radiology

## 2019-12-22 DIAGNOSIS — N6315 Unspecified lump in the right breast, overlapping quadrants: Secondary | ICD-10-CM | POA: Diagnosis not present

## 2019-12-22 DIAGNOSIS — C50811 Malignant neoplasm of overlapping sites of right female breast: Secondary | ICD-10-CM | POA: Diagnosis not present

## 2019-12-23 ENCOUNTER — Telehealth: Payer: Self-pay | Admitting: Oncology

## 2019-12-23 NOTE — Telephone Encounter (Signed)
Spoke to patient to confirm afternoon [facility] appointment for 5/12, packet emailed to patient

## 2019-12-25 ENCOUNTER — Encounter: Payer: Self-pay | Admitting: *Deleted

## 2019-12-25 DIAGNOSIS — Z170 Estrogen receptor positive status [ER+]: Secondary | ICD-10-CM

## 2019-12-25 DIAGNOSIS — C50411 Malignant neoplasm of upper-outer quadrant of right female breast: Secondary | ICD-10-CM

## 2019-12-30 NOTE — Progress Notes (Signed)
Deborah Nunez  [phone] [phone]     ID: Deborah Nunez DOB: Apr 05, 1946  MR#: [id number]  NGE#:[id number]  Patient Care Team: Merrilee Seashore, MD as PCP - General (Internal Medicine) Mauro Kaufmann, RN as Oncology Nurse Navigator Rockwell Germany, RN as Oncology Nurse Navigator Kyung Rudd, MD as Consulting Physician (Radiation Oncology) Saranda Legrande, Virgie Dad, MD as Consulting Physician (Oncology) Erroll Luna, MD as Consulting Physician (General Surgery) Servando Salina, MD as Consulting Physician (Obstetrics and Gynecology) Arta Silence, MD as Consulting Physician (Gastroenterology) Chauncey Cruel, MD OTHER MD:  CHIEF COMPLAINT: estrogen receptor positive breast cancer  CURRENT TREATMENT: Awaiting definitive surgery   HISTORY OF CURRENT ILLNESS: Deborah Nunez had routine screening mammography on 11/21/2019 showing a possible abnormality in the right breast. She underwent right diagnostic mammography with tomography and right breast ultrasonography at [facility] on 12/11/2019 showing: breast density category B; 0.4 cm irregular mass in right breast at 12 o'clock; right axilla not evaluated.  Accordingly on 12/22/2019 she proceeded to biopsy of the right breast area in question. The pathology from this procedure ([id number]) showed: invasive ductal carcinoma, grade 1; ductal carcinoma in situ. Prognostic indicators significant for: estrogen receptor, 100% positive and progesterone receptor, 80% positive, both with strong staining intensity. Proliferation marker Ki67 at 1%. HER2 equivocal by immunohistochemistry (2+), but negative by fluorescent in situ hybridization with a signals ratio 1.3 and number per cell 2.15.  The patient's subsequent history is as detailed below.   INTERVAL HISTORY: Deborah Nunez was evaluated in the multidisciplinary breast cancer clinic on 12/31/2019 accompanied by her friend         Careers information officer. Her case was also presented at the  multidisciplinary breast cancer conference on the same day. At that time a preliminary plan was proposed: Breast conserving surgery with no sentinel lymph node sampling, antiestrogens, adjuvant radiation   REVIEW OF SYSTEMS: There were no specific symptoms leading to the original mammogram, which was routinely scheduled. On the provided questionnaire, she reports wearing glasses, sinus problems, dry cough, dribbling urine, back pain, arthritis, and history of pulmonary embolism in 2006. The patient denies unusual headaches, visual changes, nausea, vomiting, stiff neck, dizziness, or gait imbalance. There has been no cough, phlegm production, or pleurisy, no chest pain or pressure, and no change in bowel or bladder habits. The patient denies fever, rash, bleeding, unexplained fatigue or unexplained weight loss. A detailed review of systems was otherwise entirely negative.   PAST MEDICAL HISTORY: Past Medical History:  Diagnosis Date  . Arthritis   . Hyperlipidemia   . Hypertension   . Mucoid cyst of joint    right index  . Pulmonary embolism ([place]) 2006    PAST SURGICAL HISTORY: Past Surgical History:  Procedure Laterality Date  . CYST EXCISION Right 07/20/2015   Procedure: EXCISION MUCOID CYST RIGHT INDEX ;  Surgeon: Daryll Brod, MD;  Location: [place];  Service: Orthopedics;  Laterality: Right;  . TONSILLECTOMY    . TUBAL LIGATION      FAMILY HISTORY: Family History  Problem Relation Age of Onset  . Lung cancer Father 25  . Stomach cancer Maternal Uncle     Her father died at age 64 from lung cancer, which was diagnosed at age 44. He was a smoker. Her mother died at age 45. [name] has one brother and one sister. She also reports stomach cancer in a maternal uncle at age 2.   GYNECOLOGIC HISTORY:  No LMP recorded. Patient is postmenopausal. Menarche: 74  years old Age at first live birth: 74 years old [place] P 2 LMP 1999 Contraceptive: used for approximately 6  years HRT: used from about 1995 - 2004  Hysterectomy? no BSO? 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 She is widowed. She lives at home by herself, with no pets. Son Deborah Nunez [facility] [place], age 82, works as a Cytogeneticist in [place], [place]. Son Deborah Nunez died at age 74 from kidney failure.  She has 1 biological and one stepgrandchild.  She is [employer].    ADVANCED DIRECTIVES: Her son Deborah Nunez is her HCPOA. He can be reached at [phone].   HEALTH MAINTENANCE: Social History   Tobacco Use  . Smoking status: Current Every Day Smoker    Packs/day: 0.50  . Smokeless tobacco: Never Used  Substance Use Topics  . Alcohol use: Yes    Comment: social  . Drug use: No     Colonoscopy: 10/2018  PAP: 01/2019  Bone density: 11/2019   No Known Allergies  Current Outpatient Medications  Medication Sig Dispense Refill  . atorvastatin (LIPITOR) 20 MG tablet     . losartan (COZAAR) 100 MG tablet     . Multiple Vitamin (MULTIVITAMIN WITH MINERALS) TABS tablet Take 1 tablet by mouth daily.    . Omega-3 Fatty Acids (FISH OIL) 500 MG CAPS Take by mouth.    . risedronate (ACTONEL) 35 MG tablet     . valACYclovir (VALTREX) 1000 MG tablet Take by mouth.    [name] HYDROcodone-acetaminophen (NORCO) 5-325 MG tablet Take 1 tablet by mouth every 6 (six) hours as needed for moderate pain. (Patient not taking: Reported on 12/31/2019) 30 tablet 0   No current facility-administered medications for this visit.    OBJECTIVE: African-American woman in no acute distress  Vitals:   12/31/19 1301  BP: 138/87  Pulse: 87  Resp: 17  Temp: 98.5 F (36.9 C)  SpO2: 99%     Body mass index is 30.6 kg/m.   Wt Readings from Last 3 Encounters:  12/31/19 189 lb 9.6 oz (86 kg)  07/20/15 192 lb (87.1 kg)      ECOG FS:1 - Symptomatic but completely ambulatory  Ocular: Sclerae unicteric,  pupils round and equal Ear-nose-throat: Wearing a mask Lymphatic: No cervical or supraclavicular adenopathy Lungs no rales or rhonchi Heart regular rate and rhythm Abd soft, nontender, positive bowel sounds MSK no focal spinal tenderness, no joint edema Neuro: non-focal, well-oriented, appropriate affect Breasts: The right breast is status post recent biopsy.  I do not palpate a mass and there is no skin or nipple change of concern.  The left breast is benign.  Both axillae are benign.   LAB RESULTS:  CMP     Component Value Date/Time   NA 143 12/31/2019 1230   K 4.1 12/31/2019 1230   CL 108 12/31/2019 1230   CO2 24 12/31/2019 1230   GLUCOSE 83 12/31/2019 1230   BUN 12 12/31/2019 1230   CREATININE 0.80 12/31/2019 1230   CALCIUM 9.6 12/31/2019 1230   PROT 7.0 12/31/2019 1230   ALBUMIN 3.7 12/31/2019 1230   AST 20 12/31/2019 1230   ALT 26 12/31/2019 1230   ALKPHOS 193 (H) 12/31/2019 1230   BILITOT 0.6 12/31/2019 1230   GFRNONAA >60 12/31/2019 1230   GFRAA >60 12/31/2019 1230    No results found for: TOTALPROTELP, ALBUMINELP, A1GS, A2GS, BETS, BETA2SER, [place], MSPIKE,  SPEI  Lab Results  Component Value Date   WBC 11.0 (H) 12/31/2019   NEUTROABS 7.4 12/31/2019   HGB 13.6 12/31/2019   HCT 41.9 12/31/2019   MCV 101.9 (H) 12/31/2019   PLT 370 12/31/2019    No results found for: LABCA2  No components found for: EHOZYY482  No results for input(s): INR in the last 168 hours.  No results found for: LABCA2  No results found for: NOI370  No results found for: WUG891  No results found for: QXI503  No results found for: CA2729  No components found for: HGQUANT  No results found for: CEA1 / No results found for: CEA1   No results found for: AFPTUMOR  No results found for: CHROMOGRNA  No results found for: KPAFRELGTCHN, LAMBDASER, KAPLAMBRATIO (kappa/lambda light chains)  No results found for: HGBA, HGBA2QUANT, HGBFQUANT, HGBSQUAN (Hemoglobinopathy evaluation)     No results found for: LDH  No results found for: IRON, TIBC, IRONPCTSAT (Iron and TIBC)  No results found for: FERRITIN  Urinalysis No results found for: COLORURINE, APPEARANCEUR, LABSPEC, PHURINE, GLUCOSEU, HGBUR, BILIRUBINUR, KETONESUR, PROTEINUR, UROBILINOGEN, NITRITE, LEUKOCYTESUR   STUDIES: No results found.   ELIGIBLE FOR AVAILABLE RESEARCH PROTOCOL: AET  ASSESSMENT: 74 y.o. [place] woman status post right breast upper outer quadrant biopsy 12/22/2019 for a clinical T1a N0, stage IA invasive ductal carcinoma, grade 1, estrogen and progesterone receptor positive, HER-2 not amplified, with an MIB-1 of 1%  (1) definitive surgery pending  (2) adjuvant radiation to follow  (3) to start anastrozole 02/23/2020  (a) patient has a remote history of pulmonary embolus and is not a candidate for tamoxifen.  PLAN: I met today with Aubryn to review her new diagnosis. Specifically we discussed the biology of her breast cancer, its diagnosis, staging, treatment  options and prognosis. We first reviewed the fact that cancer is not one disease but more than 100 different diseases and that it is important to keep them separate-- otherwise when friends and relatives discuss their own cancer experiences with [place] confusion can result. Similarly we explained that if breast cancer spreads to the bone or liver, the patient would not have bone cancer or liver cancer, but breast cancer in the bone and breast cancer in the liver: one cancer in three places-- not 3 different cancers which otherwise would have to be treated in 3 different ways.  We discussed the difference between local and systemic therapy. In terms of loco-regional treatment, lumpectomy plus radiation is equivalent to mastectomy as far as survival is concerned. For this reason, and because the cosmetic results are generally superior, we recommend breast conserving surgery.   We then discussed the rationale for systemic therapy.  There is some risk that this cancer may have already spread to other parts of her body. Patients frequently ask at this point about bone scans, CAT scans and PET scans to find out if they have occult breast cancer somewhere else. The problem is that in early stage disease we are much more likely to find false positives then true cancers and this would expose the patient to unnecessary procedures as well as unnecessary radiation. Scans cannot answer the question the patient really would like to know, which is whether she has microscopic disease elsewhere in her body. For those reasons we do not recommend them.  Of course we would proceed to aggressive evaluation of any symptoms that might suggest metastatic disease, but that is not the case here.  Next we went over the options for systemic  therapy which are anti-estrogens, anti-HER-2 immunotherapy, and chemotherapy. Jaydalyn does not meet criteria for anti-HER-2 immunotherapy. She is a good candidate for anti-estrogens.  The question of chemotherapy is more complicated. Chemotherapy is most effective in rapidly growing, aggressive tumors. It is much less effective in low-grade, slow growing cancers, like Pang 's. In addition the risk of disseminated disease in tumors this size is sufficiently low that chemotherapy would not be expected  to affect survival. Accordingly the plain is for surgery, radiation and anti-estrogens.  She is a poor candidate for tamoxifen because of the prior history of clotting.  Accordingly we discussed anastrozole in detail and she is aware of the possible complications toxicities and side effects of this agent.  Tentatively she will be starting 02/23/2020 after she recovers from her adjuvant radiation.  I will then check in with her in September and make sure she is tolerating it well.  From that point we can start yearly follow-up  Latiffany has a good understanding of the overall plan. She agrees with it. She knows the goal of  treatment in her case is cure. She will call with any problems that may develop before her next visit here.  Total encounter time 50 minutes.Sarajane Jews C. Erandy Mceachern, MD 12/31/2019 5:32 PM Medical Oncology and Hematology [facility], [place] [address] Tel. [phone]    Fax. [phone]   This document serves as a record of services personally performed by Lurline Del, MD. It was created on his behalf by Wilburn Mylar, a trained medical scribe. The creation of this record is based on the scribe's personal observations and the provider's statements to them.   I, Lurline Del MD, have reviewed the above documentation for accuracy and completeness, and I agree with the above.    *Total Encounter Time as defined by the Centers for Medicare and Medicaid Services includes, in addition to the face-to-face time of a patient visit (documented in the note above) non-face-to-face time: obtaining and reviewing outside history, ordering and reviewing medications, tests or procedures, care coordination (communications with other health care professionals or caregivers) and documentation in the medical record.

## 2019-12-31 ENCOUNTER — Encounter: Payer: Self-pay | Admitting: General Practice

## 2019-12-31 ENCOUNTER — Other Ambulatory Visit: Payer: Self-pay

## 2019-12-31 ENCOUNTER — Ambulatory Visit: Payer: Self-pay | Admitting: Surgery

## 2019-12-31 ENCOUNTER — Ambulatory Visit: Payer: Medicare PPO | Admitting: Physical Therapy

## 2019-12-31 ENCOUNTER — Encounter: Payer: Self-pay | Admitting: *Deleted

## 2019-12-31 ENCOUNTER — Ambulatory Visit
Admission: RE | Admit: 2019-12-31 | Discharge: 2019-12-31 | Disposition: A | Discharge status: Home / Self Care | Payer: Medicare PPO | Source: Ambulatory Visit | Attending: Radiation Oncology | Admitting: Radiation Oncology

## 2019-12-31 ENCOUNTER — Inpatient Hospital Stay (HOSPITAL_BASED_OUTPATIENT_CLINIC_OR_DEPARTMENT_OTHER): Payer: Medicare PPO | Admitting: Oncology

## 2019-12-31 ENCOUNTER — Other Ambulatory Visit: Payer: Self-pay | Admitting: *Deleted

## 2019-12-31 ENCOUNTER — Inpatient Hospital Stay: Payer: Medicare PPO | Attending: Oncology

## 2019-12-31 ENCOUNTER — Encounter: Payer: Self-pay | Admitting: Oncology

## 2019-12-31 VITALS — BP 138/87 | HR 87 | Temp 98.5°F | Resp 17 | Ht 66.0 in | Wt 189.6 lb

## 2019-12-31 DIAGNOSIS — I10 Essential (primary) hypertension: Secondary | ICD-10-CM | POA: Insufficient documentation

## 2019-12-31 DIAGNOSIS — C50411 Malignant neoplasm of upper-outer quadrant of right female breast: Secondary | ICD-10-CM | POA: Diagnosis not present

## 2019-12-31 DIAGNOSIS — Z86711 Personal history of pulmonary embolism: Secondary | ICD-10-CM | POA: Diagnosis not present

## 2019-12-31 DIAGNOSIS — Z170 Estrogen receptor positive status [ER+]: Secondary | ICD-10-CM

## 2019-12-31 DIAGNOSIS — E785 Hyperlipidemia, unspecified: Secondary | ICD-10-CM | POA: Insufficient documentation

## 2019-12-31 DIAGNOSIS — F17210 Nicotine dependence, cigarettes, uncomplicated: Secondary | ICD-10-CM | POA: Diagnosis not present

## 2019-12-31 DIAGNOSIS — C50911 Malignant neoplasm of unspecified site of right female breast: Secondary | ICD-10-CM | POA: Diagnosis not present

## 2019-12-31 DIAGNOSIS — M1990 Unspecified osteoarthritis, unspecified site: Secondary | ICD-10-CM | POA: Diagnosis not present

## 2019-12-31 DIAGNOSIS — Z79899 Other long term (current) drug therapy: Secondary | ICD-10-CM | POA: Diagnosis not present

## 2019-12-31 LAB — CMP (CANCER CENTER ONLY)
ALT: 26 U/L (ref 0–44)
AST: 20 U/L (ref 15–41)
Albumin: 3.7 g/dL (ref 3.5–5.0)
Alkaline Phosphatase: 193 U/L — ABNORMAL HIGH (ref 38–126)
Anion gap: 11 (ref 5–15)
BUN: 12 mg/dL (ref 8–23)
CO2: 24 mmol/L (ref 22–32)
Calcium: 9.6 mg/dL (ref 8.9–10.3)
Chloride: 108 mmol/L (ref 98–111)
Creatinine: 0.8 mg/dL (ref 0.44–1.00)
GFR, Est AFR Am: >60 mL/min (ref >60)
GFR, Estimated: >60 mL/min (ref >60)
Glucose, Bld: 83 mg/dL (ref 70–99)
Potassium: 4.1 mmol/L (ref 3.5–5.1)
Sodium: 143 mmol/L (ref 135–145)
Total Bilirubin: 0.6 mg/dL (ref 0.3–1.2)
Total Protein: 7 g/dL (ref 6.5–8.1)

## 2019-12-31 LAB — CBC WITH DIFFERENTIAL (CANCER CENTER ONLY)
Abs Immature Granulocytes: 0.02 10*3/uL (ref 0.00–0.07)
Basophils Absolute: 0 10*3/uL (ref 0.0–0.1)
Basophils Relative: 0 %
Eosinophils Absolute: 0.1 10*3/uL (ref 0.0–0.5)
Eosinophils Relative: 1 %
HCT: 41.9 % (ref 36.0–46.0)
Hemoglobin: 13.6 g/dL (ref 12.0–15.0)
Immature Granulocytes: 0 %
Lymphocytes Relative: 22 %
Lymphs Abs: 2.4 10*3/uL (ref 0.7–4.0)
MCH: 33.1 pg (ref 26.0–34.0)
MCHC: 32.5 g/dL (ref 30.0–36.0)
MCV: 101.9 fL — ABNORMAL HIGH (ref 80.0–100.0)
Monocytes Absolute: 1 10*3/uL (ref 0.1–1.0)
Monocytes Relative: 9 %
Neutro Abs: 7.4 10*3/uL (ref 1.7–7.7)
Neutrophils Relative %: 68 %
Platelet Count: 370 10*3/uL (ref 150–400)
RBC: 4.11 MIL/uL (ref 3.87–5.11)
RDW: 11.3 % — ABNORMAL LOW (ref 11.5–15.5)
WBC Count: 11 10*3/uL — ABNORMAL HIGH (ref 4.0–10.5)
nRBC: 0 % (ref 0.0–0.2)

## 2019-12-31 LAB — GENETIC SCREENING ORDER

## 2019-12-31 MED ORDER — ANASTROZOLE 1 MG PO TABS
1.0000 mg | ORAL_TABLET | Freq: Every day | ORAL | 4 refills | Status: DC
Start: 2019-12-31 — End: 2020-03-22

## 2019-12-31 NOTE — H&P (Signed)
Deborah Nunez Appointment: 12/31/2019 1:00 PM Location: [facility] Patient #: [id number] DOB: 02/07/1946 Undefined / Language: Cleophus Molt / Race: Black or African American Female  History of Present Illness Marcello Moores A. Chelsee Hosie MD; 12/31/2019 3:02 PM) Patient words: Pt seen at the [facility] for 4 mm right breast cancer UOQ. No hx of mass ,nipple discharge or pain. No family hx breast cancer. Core showed grade 1 IDC ER / PR POS her 2 neu negative. Sore from biopsy.  The patient is a 74 year old female.   Medication History Conni Slipper, RN; 12/31/2019 8:12 AM) Medications Reconciled     Review of Systems Marcello Moores A. Jamire Shabazz MD; 12/31/2019 3:03 PM) All other systems negative   Physical Exam (Tabari Volkert A. Shivan Hodes MD; 12/31/2019 3:03 PM)  General Mental Status-Alert. General Appearance-Consistent with stated age. Hydration-Well hydrated. Voice-Normal.  Chest and Lung Exam Chest and lung exam reveals -quiet, even and easy respiratory effort with no use of accessory muscles and on auscultation, normal breath sounds, no adventitious sounds and normal vocal resonance. Inspection Chest Wall - Normal. Back - normal.  Breast Breast - Left-Symmetric, Non Tender, No Biopsy scars, no Dimpling - Left, No Inflammation, No Lumpectomy scars, No Mastectomy scars, No Peau d' Orange. Breast - Right-Symmetric, Non Tender, No Biopsy scars, no Dimpling - Right, No Inflammation, No Lumpectomy scars, No Mastectomy scars, No Peau d' Orange. Breast Lump-No Palpable Breast Mass.  Cardiovascular Cardiovascular examination reveals -normal heart sounds, regular rate and rhythm with no murmurs and normal pedal pulses bilaterally.  Neurologic Neurologic evaluation reveals -alert and oriented x 3 with no impairment of recent or remote memory. Mental Status-Normal.  Musculoskeletal Normal Exam - Left-Upper Extremity Strength Normal and Lower Extremity Strength Normal. Normal Exam -  Right-Upper Extremity Strength Normal and Lower Extremity Strength Normal.  Lymphatic Head & Neck  General Head & Neck Lymphatics: Bilateral - Description - Normal. Axillary  General Axillary Region: Bilateral - Description - Normal. Tenderness - Non Tender.    Assessment & Plan (Toma Erichsen A. Diamonds Lippard MD; 12/31/2019 3:06 PM)  STAGE 1 BREAST CANCER, ER+, RIGHT (C50.911) Impression: very small and low grade discussed mastectomy and lumpectomy  pt has chosen right breast lumpectomy  discussed omission of SLN node due to small size, low growth rate and less than 8 % chance of LN involveMENT DISCUSSED RISK OF LE AND SHOULD PAIN DYSFUNCTION   Risk of lumpectomy include bleeding, infection, seroma, more surgery, use of seed/wire, wound care, cosmetic deformity and the need for other treatments, death , blood clots, death. Pt agrees to proceed.  TOTAL TIME 45 MINUTES  Current Plans You are being scheduled for surgery- Our schedulers will call you.  You should hear from our office's scheduling department within 5 working days about the location, date, and time of surgery. We try to make accommodations for patient's preferences in scheduling surgery, but sometimes the OR schedule or the surgeon's schedule prevents [place] from making those accommodations.  If you have not heard from our office [phone]) in 5 working days, call the office and ask for your surgeon's nurse.  If you have other questions about your diagnosis, plan, or surgery, call the office and ask for your surgeon's nurse.  Pt Education - Pamphlet Given - Breast Biopsy: discussed with patient and provided information. Pt Education - Pamphlet Given - Breast Biopsy: discussed with patient and provided information. We discussed the staging and pathophysiology of breast cancer. We discussed all of the different options for treatment for breast cancer including  surgery, chemotherapy, radiation therapy, Herceptin, and  antiestrogen therapy. We discussed a sentinel lymph node biopsy as she does not appear to having lymph node involvement right now. We discussed the performance of that with injection of radioactive tracer and blue dye. We discussed that she would have an incision underneath her axillary hairline. We discussed that there is a bout a 10-20% chance of having a positive node with a sentinel lymph node biopsy and we will await the permanent pathology to make any other first further decisions in terms of her treatment. One of these options might be to return to the operating room to perform an axillary lymph node dissection. We discussed about a 1-2% risk lifetime of chronic shoulder pain as well as lymphedema associated with a sentinel lymph node biopsy. We discussed the options for treatment of the breast cancer which included lumpectomy versus a mastectomy. We discussed the performance of the lumpectomy with a wire placement. We discussed a 10-20% chance of a positive margin requiring reexcision in the operating room. We also discussed that she may need radiation therapy or antiestrogen therapy or both if she undergoes lumpectomy. We discussed the mastectomy and the postoperative care for that as well. We discussed that there is no difference in her survival whether she undergoes lumpectomy with radiation therapy or antiestrogen therapy versus a mastectomy. There is a slight difference in the local recurrence rate being 3-5% with lumpectomy and about 1% with a mastectomy. We discussed the risks of operation including bleeding, infection, possible reoperation. She understands her further therapy will be based on what her stages at the time of her operation.  Pt Education - [facility] Breast Biopsy HCI: discussed with patient and provided information. Pt Education - [facility] Breast Pains Education Pt Education - ABC (After Breast Cancer) Class Info: discussed with patient and provided information.

## 2019-12-31 NOTE — Progress Notes (Signed)
[place] Psychosocial Distress Screening Spiritual Care  Met with [name] and her cousin [name] in Breast Multidisciplinary Clinic to introduce [facility] team/resources, reviewing distress screen per protocol.  The patient scored a 3 on the Psychosocial Distress Thermometer which indicates mild distress. Also assessed for distress and other psychosocial needs.   ONCBCN DISTRESS SCREENING 12/31/2019  Screening Type Initial Screening  Distress experienced in past week (1-10) 3  Emotional problem type Adjusting to illness  Information Concerns Type Lack of info about treatment;Lack of info about complementary therapy choices  Referral to support programs Yes   [name] notes that she has come back to her sense of internal peace after a brief period of distress at the unknown. She and her cousin both described how helpful and constructive [facility] was for them in resolving information concerns, meeting team, learning scope of diagnosis and plan of care, etc. Perspective is an important coping tool for [name], as she has healed through significant grief and loss at other times of her life. She is interested in [employer] healing arts programs.   Follow up needed: No. No other needs/concerns at this time, but please do page if needs arise or circumstances change. Thank you.   [place], [place], [facility] Pager [phone] Voicemail [phone]

## 2019-12-31 NOTE — Progress Notes (Signed)
Radiation Oncology         [phone] ________________________________  Name: Chanteria Haggard        MRN: [id number]  Date of Service: 12/31/2019 DOB: [birth year]  [id number], Mauro Kaufmann, MD  Erroll Luna, MD     REFERRING PHYSICIAN: Erroll Luna, MD   DIAGNOSIS: The encounter diagnosis was Malignant neoplasm of upper-outer quadrant of right breast in female, estrogen receptor positive ([place]).   HISTORY OF PRESENT ILLNESS: Capria Cartaya is a 74 y.o. female seen in the multidisciplinary breast clinic for a new diagnosis of right breast cancer. 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  She comes today to discuss treatment recommendations of her cancer.    PREVIOUS RADIATION THERAPY: No   PAST MEDICAL HISTORY:  Past Medical History:  Diagnosis Date  . Mucoid cyst of joint    right index  . Pulmonary embolism ([place]) 2006       PAST SURGICAL HISTORY: Past Surgical History:  Procedure Laterality Date  . CYST EXCISION Right 07/20/2015   Procedure: EXCISION MUCOID CYST RIGHT INDEX ;  Surgeon: Daryll Brod, MD;  Location: [facility];  Service: Orthopedics;  Laterality: Right;  . TONSILLECTOMY    . TUBAL LIGATION       FAMILY HISTORY: No family history on file.   SOCIAL HISTORY:  reports that she has quit smoking. She does not have any smokeless tobacco history on file. She reports current alcohol use. She reports that she does not use drugs.  The patient is single and lives in [place].  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   ALLERGIES: Patient has no known allergies.   MEDICATIONS:  Current Outpatient Medications    Medication Sig Dispense Refill  . HYDROcodone-acetaminophen (NORCO) 5-325 MG tablet Take 1 tablet by mouth every 6 (six) hours as needed for moderate pain. 30 tablet 0  . Multiple Vitamin (MULTIVITAMIN WITH MINERALS) TABS tablet Take 1 tablet by mouth daily.     No current facility-administered medications for this visit.     REVIEW OF SYSTEMS: On review of systems, the patient reports that she is doing well overall. No specific complaints are noted.     PHYSICAL EXAM:  Wt Readings from Last 3 Encounters:  07/20/15 192 lb (87.1 kg)   Temp Readings from Last 3 Encounters:  07/20/15 97.7 F (36.5 C)   BP Readings from Last 3 Encounters:  07/20/15 133/82   Pulse Readings from Last 3 Encounters:  07/20/15 79    In general this is a well appearing African-American female in no acute distress. She's alert and oriented x4 and appropriate throughout the examination. Cardiopulmonary assessment is negative for acute distress and she exhibits normal effort. Bilateral breast exam is deferred.    ECOG = 0  0 - Asymptomatic (Fully active, able to carry on all predisease activities without restriction)  1 - Symptomatic but completely ambulatory (Restricted in physically strenuous activity but ambulatory and able to carry out work of a light or sedentary nature. For example, light housework, office work)  2 - Symptomatic, <50% in bed during the day (Ambulatory and capable of all self care but unable to carry out any  work activities. Up and about more than 50% of waking hours)  3 - Symptomatic, >50% in bed, but not bedbound (Capable of only limited self-care, confined to bed or chair 50% or more of waking hours)  4 - Bedbound (Completely disabled. Cannot carry on any self-care. Totally confined to bed or chair)  5 - Death   Eustace Pen MM, Creech RH, Tormey DC, et al. [phone]). "Toxicity and response criteria of the [facility] [employer]". [place] Oncol. 5 (6):  649-55    LABORATORY DATA:  No results found for: WBC, HGB, HCT, MCV, PLT No results found for: NA, K, CL, CO2 No results found for: ALT, AST, GGT, ALKPHOS, BILITOT    RADIOGRAPHY: No results found.     IMPRESSION/PLAN: 1. Stage IA, [id number] grade 1, ER/PR positive invasive ductal carcinoma of the right breast. Dr. Lisbeth Renshaw discusses the pathology findings and reviews the nature of invasive left breast disease. The consensus from the breast conference includes breast conservation with lumpectomy.  We discussed in conference this morning that there would be consideration for possible radiotherapy depending on the wishes of the patient though this could be something she would forego.  She would be recommended to pursue adjuvant antiestrogen therapy. We discussed the risks, benefits, short, and long term effects of radiotherapy, and the patient is interested in proceeding after considering all of her options of adjuvant decision making. Dr. Lisbeth Renshaw discusses the delivery and logistics of radiotherapy and anticipates a course of 4 weeks of radiotherapy. We will see her back about 2 weeks after surgery to discuss the simulation process and anticipate we starting radiotherapy about 4-6 weeks after surgery.    In a visit lasting 60 minutes, greater than 50% of the time was spent face to face reviewing her case, as well as in preparation of, discussing, and coordinating the patient's care.  The above documentation reflects my direct findings during this shared patient visit. Please see the separate note by Dr. Lisbeth Renshaw on this date for the remainder of the patient's plan of care.    Carola Rhine, PAC

## 2019-12-31 NOTE — H&P (View-Only) (Signed)
Deborah Nunez Appointment: 12/31/2019 1:00 PM Location: [facility] Patient #: [id number] DOB: 12-31-1945 Undefined / Language: Cleophus Molt / Race: Black or African American Female  History of Present Illness Marcello Moores A. Chrishaun Sasso MD; 12/31/2019 3:02 PM) Patient words: Pt seen at the [facility] for 4 mm right breast cancer UOQ. No hx of mass ,nipple discharge or pain. No family hx breast cancer. Core showed grade 1 IDC ER / PR POS her 2 neu negative. Sore from biopsy.  The patient is a 74 year old female.   Medication History Conni Slipper, RN; 12/31/2019 8:12 AM) Medications Reconciled     Review of Systems Marcello Moores A. Tavish Gettis MD; 12/31/2019 3:03 PM) All other systems negative   Physical Exam (Rollins Wrightson A. Lindalou Soltis MD; 12/31/2019 3:03 PM)  General Mental Status-Alert. General Appearance-Consistent with stated age. Hydration-Well hydrated. Voice-Normal.  Chest and Lung Exam Chest and lung exam reveals -quiet, even and easy respiratory effort with no use of accessory muscles and on auscultation, normal breath sounds, no adventitious sounds and normal vocal resonance. Inspection Chest Wall - Normal. Back - normal.  Breast Breast - Left-Symmetric, Non Tender, No Biopsy scars, no Dimpling - Left, No Inflammation, No Lumpectomy scars, No Mastectomy scars, No Peau d' Orange. Breast - Right-Symmetric, Non Tender, No Biopsy scars, no Dimpling - Right, No Inflammation, No Lumpectomy scars, No Mastectomy scars, No Peau d' Orange. Breast Lump-No Palpable Breast Mass.  Cardiovascular Cardiovascular examination reveals -normal heart sounds, regular rate and rhythm with no murmurs and normal pedal pulses bilaterally.  Neurologic Neurologic evaluation reveals -alert and oriented x 3 with no impairment of recent or remote memory. Mental Status-Normal.  Musculoskeletal Normal Exam - Left-Upper Extremity Strength Normal and Lower Extremity Strength Normal. Normal Exam -  Right-Upper Extremity Strength Normal and Lower Extremity Strength Normal.  Lymphatic Head & Neck  General Head & Neck Lymphatics: Bilateral - Description - Normal. Axillary  General Axillary Region: Bilateral - Description - Normal. Tenderness - Non Tender.    Assessment & Plan (Dusty Raczkowski A. Pavneet Markwood MD; 12/31/2019 3:06 PM)  STAGE 1 BREAST CANCER, ER+, RIGHT (C50.911) Impression: very small and low grade discussed mastectomy and lumpectomy  pt has chosen right breast lumpectomy  discussed omission of SLN node due to small size, low growth rate and less than 8 % chance of LN involveMENT DISCUSSED RISK OF LE AND SHOULD PAIN DYSFUNCTION   Risk of lumpectomy include bleeding, infection, seroma, more surgery, use of seed/wire, wound care, cosmetic deformity and the need for other treatments, death , blood clots, death. Pt agrees to proceed.  TOTAL TIME 45 MINUTES  Current Plans You are being scheduled for surgery- Our schedulers will call you.  You should hear from our office's scheduling department within 5 working days about the location, date, and time of surgery. We try to make accommodations for patient's preferences in scheduling surgery, but sometimes the OR schedule or the surgeon's schedule prevents [place] from making those accommodations.  If you have not heard from our office [phone]) in 5 working days, call the office and ask for your surgeon's nurse.  If you have other questions about your diagnosis, plan, or surgery, call the office and ask for your surgeon's nurse.  Pt Education - Pamphlet Given - Breast Biopsy: discussed with patient and provided information. Pt Education - Pamphlet Given - Breast Biopsy: discussed with patient and provided information. We discussed the staging and pathophysiology of breast cancer. We discussed all of the different options for treatment for breast cancer including  surgery, chemotherapy, radiation therapy, Herceptin, and  antiestrogen therapy. We discussed a sentinel lymph node biopsy as she does not appear to having lymph node involvement right now. We discussed the performance of that with injection of radioactive tracer and blue dye. We discussed that she would have an incision underneath her axillary hairline. We discussed that there is a bout a 10-20% chance of having a positive node with a sentinel lymph node biopsy and we will await the permanent pathology to make any other first further decisions in terms of her treatment. One of these options might be to return to the operating room to perform an axillary lymph node dissection. We discussed about a 1-2% risk lifetime of chronic shoulder pain as well as lymphedema associated with a sentinel lymph node biopsy. We discussed the options for treatment of the breast cancer which included lumpectomy versus a mastectomy. We discussed the performance of the lumpectomy with a wire placement. We discussed a 10-20% chance of a positive margin requiring reexcision in the operating room. We also discussed that she may need radiation therapy or antiestrogen therapy or both if she undergoes lumpectomy. We discussed the mastectomy and the postoperative care for that as well. We discussed that there is no difference in her survival whether she undergoes lumpectomy with radiation therapy or antiestrogen therapy versus a mastectomy. There is a slight difference in the local recurrence rate being 3-5% with lumpectomy and about 1% with a mastectomy. We discussed the risks of operation including bleeding, infection, possible reoperation. She understands her further therapy will be based on what her stages at the time of her operation.  Pt Education - [facility] Breast Biopsy HCI: discussed with patient and provided information. Pt Education - [facility] Breast Pains Education Pt Education - ABC (After Breast Cancer) Class Info: discussed with patient and provided information.

## 2020-01-01 ENCOUNTER — Telehealth: Payer: Self-pay | Admitting: Oncology

## 2020-01-01 NOTE — Telephone Encounter (Signed)
Scheduled appt per 5/12 los. Called pt to discuss appt details. Sent pt a mychart sign-up link and gave pt the phone number for tech support should she need help with setting up her mychart. Pt did express a little bit of frustration about the appointment being a virtual appt. I told pt I would be happy to send GM a message and ask that it be an in office visit. Pt declined my offer and stated that she would wait until it gets closer to the appointment if she felt it needed to be changed. Gave pt my direct number for any scheduling questions.

## 2020-01-02 ENCOUNTER — Telehealth: Payer: Self-pay | Admitting: Adult Health

## 2020-01-02 ENCOUNTER — Other Ambulatory Visit: Payer: Self-pay | Admitting: Adult Health

## 2020-01-02 NOTE — Telephone Encounter (Signed)
Called patient to discuss AET study.  LMOM for her to return my call.  Wilber Bihari, NP

## 2020-01-03 ENCOUNTER — Encounter (INDEPENDENT_AMBULATORY_CARE_PROVIDER_SITE_OTHER): Payer: Self-pay

## 2020-01-06 NOTE — Pre-Procedure Instructions (Signed)
[id number] [address], [place] [address] Phone: [phone] Fax: [phone]    Your procedure is scheduled on Thurs., Jan 15, 2020 from 1:00PM-2:00PM  Report to [facility] Entrance "A" at 11:00AM  Call this number if you have problems the morning of surgery:  [phone]   Remember:  Do not eat after midnight on May 26th  You may drink clear liquids until 3 hours (10:00AM) prior to surgery time .  Clear liquids allowed are: Water, Juice (non-citric and without pulp - diabetics please choose diet or no sugar options), Carbonated beverages - (diabetics please choose diet or no sugar options), Clear Tea, Black Coffee only (no creamer, milk or cream including half and half), Plain Jell-O only (diabetics please choose diet or no sugar options), Gatorade (diabetics please choose diet or no sugar options) and Plain Popsicles only   Please complete your PRE-SURGERY ENSURE that was provided to you by 10:00AM the morning of surgery.  Please, if able, drink it in one setting. DO NOT SIP.   Take these medicines the morning of surgery with A SIP OF WATER: Atorvastatin (LIPITOR)       If Needed: Acetaminophen (TYLENOL)  ValACYclovir (VALTREX)  7 days before surgery (01/08/20), STOP taking all Aspirin (unless instructed by your doctor) and Other Aspirin containing products, Vitamins, Fish oils, and Herbal medications. Also stop all NSAIDS i.e. Advil, Ibuprofen, Motrin, Aleve, Anaprox, Naproxen, BC, Goody Powders, and all Supplements.  No Smoking of any kind, Tobacco, or Alcohol products 24 hours prior to your procedure. If you use a Cpap at night, you may bring all equipment for your overnight stay.   Special instructions: [facility]- Preparing For Surgery  Before surgery, you can play an important role. Because skin is not sterile, your skin needs to be as free of germs as possible. You can reduce the number of germs  on your skin by washing with CHG (chlorahexidine gluconate) Soap before surgery.  CHG is an antiseptic cleaner which kills germs and bonds with the skin to continue killing germs even after washing.    Please do not use if you have an allergy to CHG or antibacterial soaps. If your skin becomes reddened/irritated stop using the CHG.  Do not shave (including legs and underarms) for at least 48 hours prior to first CHG shower. It is OK to shave your face.  Please follow these instructions carefully.   1. Shower the NIGHT BEFORE SURGERY and the MORNING OF SURGERY with CHG.   2. If you chose to wash your hair, wash your hair first as usual with your normal shampoo.  3. After you shampoo, rinse your hair and body thoroughly to remove the shampoo.  4. Use CHG as you would any other liquid soap. You can apply CHG directly to the skin and wash gently with a scrungie or a clean washcloth.   5. Apply the CHG Soap to your body ONLY FROM THE NECK DOWN.  Do not use on open wounds or open sores. Avoid contact with your eyes, ears, mouth and genitals (private parts). Wash Face and genitals (private parts)  with your normal soap.  6. Wash thoroughly, paying special attention to the area where your surgery will be performed.  7. Thoroughly rinse your body with warm water from the neck down.  8. DO NOT shower/wash with your normal soap after using and rinsing off the CHG Soap.  9. Pat yourself dry with a  CLEAN TOWEL.  10. Wear CLEAN PAJAMAS to bed the night before surgery, wear comfortable clothes the morning of surgery  11. Place CLEAN SHEETS on your bed the night of your first shower and DO NOT SLEEP WITH PETS.   Day of Surgery:            Remember to brush your teeth WITH YOUR REGULAR TOOTHPASTE.  Do not wear jewelry, make-up or nail polish.  Do not wear lotions, powders, or perfumes, or deodorant.  Do not shave 48 hours prior to surgery.    Do not bring valuables to the hospital.  [facility] is  not responsible for any belongings or valuables.  Contacts, dentures or bridgework may not be worn into surgery.    For patients admitted to the hospital, discharge time will be determined by your treatment team.  Patients discharged the day of surgery will not be allowed to drive home, and someone age 73 and over needs to stay with them for 24 hours.  Please wear clean clothes to the hospital/surgery center.    Please read over the following fact sheets that you were given.

## 2020-01-07 ENCOUNTER — Encounter (HOSPITAL_COMMUNITY): Payer: Self-pay

## 2020-01-07 ENCOUNTER — Other Ambulatory Visit: Payer: Self-pay

## 2020-01-07 ENCOUNTER — Encounter (HOSPITAL_COMMUNITY)
Admission: RE | Admit: 2020-01-07 | Discharge: 2020-01-07 | Disposition: A | Discharge status: Home / Self Care | Payer: Medicare PPO | Source: Ambulatory Visit | Attending: Surgery | Admitting: Surgery

## 2020-01-07 DIAGNOSIS — C50911 Malignant neoplasm of unspecified site of right female breast: Secondary | ICD-10-CM

## 2020-01-07 DIAGNOSIS — Z01818 Encounter for other preprocedural examination: Secondary | ICD-10-CM | POA: Diagnosis not present

## 2020-01-07 HISTORY — DX: Malignant (primary) neoplasm, unspecified: C80.1

## 2020-01-07 LAB — COMPREHENSIVE METABOLIC PANEL
ALT: 22 U/L (ref 0–44)
AST: 22 U/L (ref 15–41)
Albumin: 3.6 g/dL (ref 3.5–5.0)
Alkaline Phosphatase: 151 U/L — ABNORMAL HIGH (ref 38–126)
Anion gap: 9 (ref 5–15)
BUN: 10 mg/dL (ref 8–23)
CO2: 27 mmol/L (ref 22–32)
Calcium: 9.6 mg/dL (ref 8.9–10.3)
Chloride: 105 mmol/L (ref 98–111)
Creatinine, Ser: 0.78 mg/dL (ref 0.44–1.00)
GFR calc Af Amer: >60 mL/min (ref >60)
GFR calc non Af Amer: >60 mL/min (ref >60)
Glucose, Bld: 110 mg/dL — ABNORMAL HIGH (ref 70–99)
Potassium: 4 mmol/L (ref 3.5–5.1)
Sodium: 141 mmol/L (ref 135–145)
Total Bilirubin: 0.6 mg/dL (ref 0.3–1.2)
Total Protein: 6.8 g/dL (ref 6.5–8.1)

## 2020-01-07 LAB — CBC WITH DIFFERENTIAL/PLATELET
Abs Immature Granulocytes: 0.03 10*3/uL (ref 0.00–0.07)
Basophils Absolute: 0.1 10*3/uL (ref 0.0–0.1)
Basophils Relative: 1 %
Eosinophils Absolute: 0.1 10*3/uL (ref 0.0–0.5)
Eosinophils Relative: 1 %
HCT: 45.3 % (ref 36.0–46.0)
Hemoglobin: 14.7 g/dL (ref 12.0–15.0)
Immature Granulocytes: 0 %
Lymphocytes Relative: 16 %
Lymphs Abs: 1.4 10*3/uL (ref 0.7–4.0)
MCH: 33.6 pg (ref 26.0–34.0)
MCHC: 32.5 g/dL (ref 30.0–36.0)
MCV: 103.7 fL — ABNORMAL HIGH (ref 80.0–100.0)
Monocytes Absolute: 0.8 10*3/uL (ref 0.1–1.0)
Monocytes Relative: 9 %
Neutro Abs: 6.1 10*3/uL (ref 1.7–7.7)
Neutrophils Relative %: 73 %
Platelets: 372 10*3/uL (ref 150–400)
RBC: 4.37 MIL/uL (ref 3.87–5.11)
RDW: 11.2 % — ABNORMAL LOW (ref 11.5–15.5)
WBC: 8.5 10*3/uL (ref 4.0–10.5)
nRBC: 0 % (ref 0.0–0.2)

## 2020-01-07 NOTE — Progress Notes (Signed)
PCP - Dr. Ashby Dawes Cardiologist - denies    Chest x-ray - N/A EKG - 01/07/20 Stress Test - denies ECHO - denies Cardiac Cath - denies  Sleep Study - denies  Aspirin Instructions: Patient instructed to hold all Aspirin, NSAID's, herbal medications, fish oil and vitamins 7 days prior to surgery.   ERAS Protcol - clear liquids unto 10:00 AM PRE-SURGERY Ensure or G2- Ensure given to patient, to complete by 10 AM DOS  COVID TEST- 01/12/20 at [facility]. Pt instructed to remain in their car. Educated on Transport planner until [employer].    Anesthesia review: breast seed  Patient denies shortness of breath, fever, cough and chest pain at PAT appointment   All instructions explained to the patient, with a verbal understanding of the material. Patient agrees to go over the instructions while at home for a better understanding. Patient also instructed to self quarantine after being tested for COVID-19. The opportunity to ask questions was provided.

## 2020-01-08 ENCOUNTER — Encounter (HOSPITAL_COMMUNITY): Payer: Self-pay

## 2020-01-08 ENCOUNTER — Telehealth: Payer: Self-pay | Admitting: *Deleted

## 2020-01-08 ENCOUNTER — Encounter: Payer: Self-pay | Admitting: *Deleted

## 2020-01-08 DIAGNOSIS — Z170 Estrogen receptor positive status [ER+]: Secondary | ICD-10-CM

## 2020-01-08 NOTE — Progress Notes (Signed)
Anesthesia Chart Review:  Case: [id number] Date/Time: 01/15/20 1245   Procedure: RIGHT BREAST LUMPECTOMY WITH RADIOACTIVE SEED LOCALIZATION (Right Breast)   Anesthesia type: General   Pre-op diagnosis: RIGHT BREAST CANCER   Location: [place] OR ROOM 09 / [place]   Surgeons: Erroll Luna, MD     She is getting RSL. Has appointment at [facility] 01/14/20 @ 2:30 PM.   DISCUSSION: Patient is a 74 year old female scheduled for the above procedure.  History includes smoking, right breast cancer (12/22/19 biopsy: invasive ductal carcinoma, DCIS), HTN, HLD, PE (multiple bilateral small PE 06/28/05, following long trip and while on HRT). BMI is consistent with mild obesity.  Presurgical COVID-19 test 01/12/2020.  Anesthesia team to evaluate on the day of surgery.  VS: BP 140/85   Pulse 100   Temp 36.8 C (Oral)   Resp 18   Wt 86.2 kg   SpO2 99%   BMI 30.68 kg/m     PROVIDERS: Merrilee Seashore, MD is PCP  Lurline Del, MD is HEM-ONC Kyung Rudd, MD is RAD-ONC   LABS: Labs reviewed: Acceptable for surgery. (all labs ordered are listed, but only abnormal results are displayed)  Labs Reviewed  CBC WITH DIFFERENTIAL/PLATELET - Abnormal; Notable for the following components:      Result Value   MCV 103.7 (*)    RDW 11.2 (*)    All other components within normal limits  COMPREHENSIVE METABOLIC PANEL - Abnormal; Notable for the following components:   Glucose, Bld 110 (*)    Alkaline Phosphatase 151 (*)    All other components within normal limits     EKG: 01/07/20: Normal sinus rhythm Nonspecific T wave abnormality Abnormal ECG No significant change since 2006 Confirmed by Jenkins Rouge [phone]) on 01/07/2020 5:27:41 PM   CV: She denied prior stress, echo, cath.    Past Medical History:  Diagnosis Date  . Arthritis   . Cancer ([place])   . Hyperlipidemia   . Hypertension   . Mucoid cyst of joint    right index  . Pulmonary embolism ([place]) 2006    Past Surgical History:   Procedure Laterality Date  . CYST EXCISION Right 07/20/2015   Procedure: EXCISION MUCOID CYST RIGHT INDEX ;  Surgeon: Daryll Brod, MD;  Location: [facility];  Service: Orthopedics;  Laterality: Right;  . TONSILLECTOMY    . TUBAL LIGATION      MEDICATIONS: . acetaminophen (TYLENOL) 500 MG tablet  . anastrozole (ARIMIDEX) 1 MG tablet  . Ascorbic Acid (VITAMIN C) 1000 MG tablet  . atorvastatin (LIPITOR) 20 MG tablet  . losartan (COZAAR) 100 MG tablet  . Omega-3 Fatty Acids (FISH OIL) 500 MG CAPS  . risedronate (ACTONEL) 35 MG tablet  . valACYclovir (VALTREX) 1000 MG tablet   No current facility-administered medications for this encounter.    Myra Gianotti, PA-C Surgical Short Stay/Anesthesiology [facility] Phone [phone] [facility] Phone [phone] 01/08/2020 5:10 PM

## 2020-01-08 NOTE — Telephone Encounter (Signed)
Spoke to pt concerning [place] on 5.12.21. Denies questions or concerns regarding dx or treatment care plan. Encourage pt to call with needs. Received verbal understanding.

## 2020-01-08 NOTE — Anesthesia Preprocedure Evaluation (Addendum)
Anesthesia Evaluation  Patient identified by MRN, date of birth, ID band Patient awake    Reviewed: Allergy & Precautions, NPO status , Patient's Chart, lab work & pertinent test results  History of Anesthesia Complications Negative for: history of anesthetic complications  Airway Mallampati: II  TM Distance: >3 FB Neck ROM: Full    Dental  (+) Dental Advisory Given   Pulmonary Patient abstained from smoking., former smoker, PE   Pulmonary exam normal        Cardiovascular hypertension, Pt. on medications Normal cardiovascular exam     Neuro/Psych negative neurological ROS  negative psych ROS   GI/Hepatic negative GI ROS, Neg liver ROS,   Endo/Other   Obesity   Renal/GU negative Renal ROS     Musculoskeletal  (+) Arthritis ,   Abdominal   Peds  Hematology negative hematology ROS (+)   Anesthesia Other Findings Covid neg 5/24   Reproductive/Obstetrics  Breast cancer                             Anesthesia Physical Anesthesia Plan  ASA: II  Anesthesia Plan: General   Post-op Pain Management:    Induction: Intravenous  PONV Risk Score and Plan: 3 and Treatment may vary due to age or medical condition, Ondansetron and Dexamethasone  Airway Management Planned: LMA  Additional Equipment: None  Intra-op Plan:   Post-operative Plan: Extubation in OR  Informed Consent: I have reviewed the patients History and Physical, chart, labs and discussed the procedure including the risks, benefits and alternatives for the proposed anesthesia with the patient or authorized representative who has indicated his/her understanding and acceptance.     Dental advisory given  Plan Discussed with: CRNA and Anesthesiologist  Anesthesia Plan Comments:       Anesthesia Quick Evaluation

## 2020-01-10 ENCOUNTER — Encounter (INDEPENDENT_AMBULATORY_CARE_PROVIDER_SITE_OTHER): Payer: Self-pay

## 2020-01-12 ENCOUNTER — Encounter: Payer: Self-pay | Admitting: *Deleted

## 2020-01-12 ENCOUNTER — Other Ambulatory Visit (HOSPITAL_COMMUNITY)
Admission: RE | Admit: 2020-01-12 | Discharge: 2020-01-12 | Disposition: A | Discharge status: Home / Self Care | Payer: Medicare PPO | Source: Ambulatory Visit | Attending: Surgery | Admitting: Surgery

## 2020-01-12 DIAGNOSIS — Z01812 Encounter for preprocedural laboratory examination: Secondary | ICD-10-CM | POA: Insufficient documentation

## 2020-01-12 DIAGNOSIS — Z20822 Contact with and (suspected) exposure to covid-19: Secondary | ICD-10-CM | POA: Diagnosis not present

## 2020-01-12 LAB — SARS CORONAVIRUS 2 (TAT 6-24 HRS): SARS Coronavirus 2: NEGATIVE

## 2020-01-14 DIAGNOSIS — C50811 Malignant neoplasm of overlapping sites of right female breast: Secondary | ICD-10-CM | POA: Diagnosis not present

## 2020-01-14 NOTE — Progress Notes (Signed)
Nutrition  Patient identified by attending Breast Clinic on 12/31/19.    74 year old female with new diagnosis of breast cancer.  Planning right lumpectomy, followed by radiation and antiestrogens.  Chart reviewed.  Ht: 66 inches Wt: 190 lb, no weight loss BMI: 30  Patient was provided nutrition packet on the day of clinic by nurse navigator with RD contact information.  Patient not at nutritional risk at this time.  RD available as needed.   Vinson Tietze B. Zenia Resides, [place], [place] Registered Dietitian [phone] (pager)

## 2020-01-15 ENCOUNTER — Ambulatory Visit (HOSPITAL_COMMUNITY): Payer: Medicare PPO | Admitting: Anesthesiology

## 2020-01-15 ENCOUNTER — Encounter (HOSPITAL_COMMUNITY): Payer: Self-pay | Admitting: Surgery

## 2020-01-15 ENCOUNTER — Ambulatory Visit (HOSPITAL_COMMUNITY): Payer: Medicare PPO | Admitting: Vascular Surgery

## 2020-01-15 ENCOUNTER — Other Ambulatory Visit: Payer: Self-pay

## 2020-01-15 ENCOUNTER — Ambulatory Visit (HOSPITAL_COMMUNITY)
Admission: RE | Admit: 2020-01-15 | Discharge: 2020-01-15 | Disposition: A | Discharge status: Home / Self Care | Payer: Medicare PPO | Attending: Surgery | Admitting: Surgery

## 2020-01-15 ENCOUNTER — Encounter (HOSPITAL_COMMUNITY)
Admission: RE | Disposition: A | Discharge status: Home / Self Care | Payer: Self-pay | Source: Home / Self Care | Attending: Surgery

## 2020-01-15 DIAGNOSIS — C50911 Malignant neoplasm of unspecified site of right female breast: Secondary | ICD-10-CM

## 2020-01-15 DIAGNOSIS — I10 Essential (primary) hypertension: Secondary | ICD-10-CM | POA: Insufficient documentation

## 2020-01-15 DIAGNOSIS — M1990 Unspecified osteoarthritis, unspecified site: Secondary | ICD-10-CM | POA: Diagnosis not present

## 2020-01-15 DIAGNOSIS — Z170 Estrogen receptor positive status [ER+]: Secondary | ICD-10-CM | POA: Diagnosis not present

## 2020-01-15 DIAGNOSIS — Z86711 Personal history of pulmonary embolism: Secondary | ICD-10-CM | POA: Diagnosis not present

## 2020-01-15 DIAGNOSIS — C50411 Malignant neoplasm of upper-outer quadrant of right female breast: Secondary | ICD-10-CM | POA: Insufficient documentation

## 2020-01-15 DIAGNOSIS — N6091 Unspecified benign mammary dysplasia of right breast: Secondary | ICD-10-CM | POA: Diagnosis not present

## 2020-01-15 DIAGNOSIS — Z87891 Personal history of nicotine dependence: Secondary | ICD-10-CM | POA: Diagnosis not present

## 2020-01-15 HISTORY — PX: BREAST LUMPECTOMY WITH RADIOACTIVE SEED LOCALIZATION: SHX6424

## 2020-01-15 SURGERY — BREAST LUMPECTOMY WITH RADIOACTIVE SEED LOCALIZATION
Anesthesia: General | Site: Breast | Laterality: Right

## 2020-01-15 MED ORDER — LIDOCAINE 2% (20 MG/ML) 5 ML SYRINGE
INTRAMUSCULAR | Status: DC | PRN
  Administered 2020-01-15: 60 mg via INTRAVENOUS

## 2020-01-15 MED ORDER — BUPIVACAINE-EPINEPHRINE 0.25% -1:200000 IJ SOLN
INTRAMUSCULAR | Status: DC | PRN
  Administered 2020-01-15: 10 mL

## 2020-01-15 MED ORDER — GABAPENTIN 300 MG PO CAPS: 300.0000 mg | ORAL_CAPSULE | ORAL | Status: AC

## 2020-01-15 MED ORDER — FENTANYL CITRATE (PF) 100 MCG/2ML IJ SOLN: 25.0000 ug | INTRAMUSCULAR | Status: DC | PRN

## 2020-01-15 MED ORDER — PROPOFOL 10 MG/ML IV BOLUS
INTRAVENOUS | Status: AC
  Filled 2020-01-15: qty 20

## 2020-01-15 MED ORDER — BUPIVACAINE HCL (PF) 0.25 % IJ SOLN
INTRAMUSCULAR | Status: AC
  Filled 2020-01-15: qty 30

## 2020-01-15 MED ORDER — FENTANYL CITRATE (PF) 250 MCG/5ML IJ SOLN
INTRAMUSCULAR | Status: AC
  Filled 2020-01-15: qty 5

## 2020-01-15 MED ORDER — GABAPENTIN 300 MG PO CAPS
ORAL_CAPSULE | ORAL | Status: AC
  Administered 2020-01-15: 300 mg via ORAL
  Filled 2020-01-15: qty 1

## 2020-01-15 MED ORDER — OXYCODONE HCL 5 MG/5ML PO SOLN: 5.0000 mg | Freq: Once | ORAL | Status: DC | PRN

## 2020-01-15 MED ORDER — IBUPROFEN 800 MG PO TABS
800.0000 mg | ORAL_TABLET | Freq: Three times a day (TID) | ORAL | 0 refills | Status: AC | PRN
Start: 2020-01-15 — End: ?

## 2020-01-15 MED ORDER — ONDANSETRON HCL 4 MG/2ML IJ SOLN
INTRAMUSCULAR | Status: DC | PRN
  Administered 2020-01-15: 4 mg via INTRAVENOUS

## 2020-01-15 MED ORDER — CELECOXIB 200 MG PO CAPS: 200.0000 mg | ORAL_CAPSULE | ORAL | Status: AC

## 2020-01-15 MED ORDER — PHENYLEPHRINE 40 MCG/ML (10ML) SYRINGE FOR IV PUSH (FOR BLOOD PRESSURE SUPPORT)
PREFILLED_SYRINGE | INTRAVENOUS | Status: DC | PRN
  Administered 2020-01-15: 120 ug via INTRAVENOUS

## 2020-01-15 MED ORDER — CEFAZOLIN SODIUM-DEXTROSE 2-4 GM/100ML-% IV SOLN: 2.0000 g | INTRAVENOUS | Status: DC

## 2020-01-15 MED ORDER — CHLORHEXIDINE GLUCONATE CLOTH 2 % EX PADS: 6.0000 | MEDICATED_PAD | Freq: Once | CUTANEOUS | Status: DC

## 2020-01-15 MED ORDER — CEFAZOLIN SODIUM-DEXTROSE 2-4 GM/100ML-% IV SOLN
INTRAVENOUS | Status: AC
  Filled 2020-01-15: qty 100

## 2020-01-15 MED ORDER — DEXAMETHASONE SODIUM PHOSPHATE 10 MG/ML IJ SOLN
INTRAMUSCULAR | Status: AC
  Filled 2020-01-15: qty 1

## 2020-01-15 MED ORDER — ONDANSETRON HCL 4 MG/2ML IJ SOLN: 4.0000 mg | Freq: Once | INTRAMUSCULAR | Status: DC | PRN

## 2020-01-15 MED ORDER — PROPOFOL 10 MG/ML IV BOLUS
INTRAVENOUS | Status: DC | PRN
  Administered 2020-01-15: 150 mg via INTRAVENOUS

## 2020-01-15 MED ORDER — DEXAMETHASONE SODIUM PHOSPHATE 10 MG/ML IJ SOLN
INTRAMUSCULAR | Status: DC | PRN
  Administered 2020-01-15: 4 mg via INTRAVENOUS

## 2020-01-15 MED ORDER — OXYCODONE HCL 5 MG PO TABS: 5.0000 mg | ORAL_TABLET | Freq: Once | ORAL | Status: DC | PRN

## 2020-01-15 MED ORDER — ONDANSETRON HCL 4 MG/2ML IJ SOLN
INTRAMUSCULAR | Status: AC
  Filled 2020-01-15: qty 2

## 2020-01-15 MED ORDER — CELECOXIB 200 MG PO CAPS
ORAL_CAPSULE | ORAL | Status: AC
  Administered 2020-01-15: 200 mg via ORAL
  Filled 2020-01-15: qty 1

## 2020-01-15 MED ORDER — 0.9 % SODIUM CHLORIDE (POUR BTL) OPTIME
TOPICAL | Status: DC | PRN
  Administered 2020-01-15: 1000 mL

## 2020-01-15 MED ORDER — LIDOCAINE 2% (20 MG/ML) 5 ML SYRINGE
INTRAMUSCULAR | Status: AC
  Filled 2020-01-15: qty 5

## 2020-01-15 SURGICAL SUPPLY — 43 items
ADH SKN CLS APL DERMABOND .7 (GAUZE/BANDAGES/DRESSINGS) ×1
APL PRP STRL LF DISP 70% ISPRP (MISCELLANEOUS) ×1
APPLIER CLIP 9.375 MED OPEN (MISCELLANEOUS)
APR CLP MED 9.3 20 MLT OPN (MISCELLANEOUS)
BINDER BREAST LRG (GAUZE/BANDAGES/DRESSINGS) IMPLANT
BINDER BREAST XLRG (GAUZE/BANDAGES/DRESSINGS) IMPLANT
BINDER BREAST XXLRG (GAUZE/BANDAGES/DRESSINGS) ×2 IMPLANT
CANISTER SUCT 3000ML PPV (MISCELLANEOUS) IMPLANT
CHLORAPREP W/TINT 26 (MISCELLANEOUS) ×3 IMPLANT
CLIP APPLIE 9.375 MED OPEN (MISCELLANEOUS) IMPLANT
COVER PROBE W GEL 5X96 (DRAPES) ×3 IMPLANT
COVER SURGICAL LIGHT HANDLE (MISCELLANEOUS) ×3 IMPLANT
COVER WAND RF STERILE (DRAPES) ×3 IMPLANT
DERMABOND ADVANCED (GAUZE/BANDAGES/DRESSINGS) ×2
DERMABOND ADVANCED .7 DNX12 (GAUZE/BANDAGES/DRESSINGS) ×1 IMPLANT
DEVICE DUBIN SPECIMEN MAMMOGRA (MISCELLANEOUS) ×3 IMPLANT
DRAPE CHEST BREAST 15X10 FENES (DRAPES) ×3 IMPLANT
ELECT CAUTERY BLADE 6.4 (BLADE) ×3 IMPLANT
ELECT REM PT RETURN 9FT ADLT (ELECTROSURGICAL) ×3
ELECTRODE REM PT RTRN 9FT ADLT (ELECTROSURGICAL) ×1 IMPLANT
GLOVE BIO SURGEON STRL SZ8 (GLOVE) ×3 IMPLANT
GLOVE BIOGEL PI IND STRL 8 (GLOVE) ×1 IMPLANT
GLOVE BIOGEL PI INDICATOR 8 (GLOVE) ×2
GOWN STRL REUS W/ TWL LRG LVL3 (GOWN DISPOSABLE) ×1 IMPLANT
GOWN STRL REUS W/ TWL XL LVL3 (GOWN DISPOSABLE) ×1 IMPLANT
GOWN STRL REUS W/TWL LRG LVL3 (GOWN DISPOSABLE) ×3
GOWN STRL REUS W/TWL XL LVL3 (GOWN DISPOSABLE) ×3
KIT BASIN OR (CUSTOM PROCEDURE TRAY) ×3 IMPLANT
KIT MARKER MARGIN INK (KITS) IMPLANT
LIGHT WAVEGUIDE WIDE FLAT (MISCELLANEOUS) IMPLANT
NDL HYPO 25GX1X1/2 BEV (NEEDLE) IMPLANT
NEEDLE HYPO 25GX1X1/2 BEV (NEEDLE) IMPLANT
NS IRRIG 1000ML POUR BTL (IV SOLUTION) IMPLANT
PACK GENERAL/GYN (CUSTOM PROCEDURE TRAY) ×3 IMPLANT
PAD ABD 8X10 STRL (GAUZE/BANDAGES/DRESSINGS) ×2 IMPLANT
SUT MNCRL AB 4-0 PS2 18 (SUTURE) ×3 IMPLANT
SUT SILK 2 0 SH (SUTURE) IMPLANT
SUT VIC AB 2-0 SH 27 (SUTURE) ×3
SUT VIC AB 2-0 SH 27XBRD (SUTURE) ×1 IMPLANT
SUT VIC AB 3-0 SH 27 (SUTURE) ×3
SUT VIC AB 3-0 SH 27X BRD (SUTURE) ×1 IMPLANT
SYR CONTROL 10ML LL (SYRINGE) IMPLANT
TOWEL GREEN STERILE FF (TOWEL DISPOSABLE) IMPLANT

## 2020-01-15 NOTE — Interval H&P Note (Signed)
History and Physical Interval Note:  01/15/2020 2:21 PM  Deborah Nunez  has presented today for surgery, with the diagnosis of RIGHT BREAST CANCER.  The various methods of treatment have been discussed with the patient and family. After consideration of risks, benefits and other options for treatment, the patient has consented to  Procedure(s): RIGHT BREAST LUMPECTOMY WITH RADIOACTIVE SEED LOCALIZATION (Right) as a surgical intervention.  The patient's history has been reviewed, patient examined, no change in status, stable for surgery.  I have reviewed the patient's chart and labs.  Questions were answered to the patient's satisfaction.     [place]

## 2020-01-15 NOTE — Discharge Instructions (Signed)
[facility] °Office Phone Number [phone] ° °BREAST BIOPSY/ PARTIAL MASTECTOMY: POST OP INSTRUCTIONS ° °Always review your discharge instruction sheet given to you by the facility where your surgery was performed. ° °IF YOU HAVE DISABILITY OR FAMILY LEAVE FORMS, YOU MUST BRING THEM TO THE OFFICE FOR PROCESSING.  DO NOT GIVE THEM TO YOUR DOCTOR. ° °1. A prescription for pain medication may be given to you upon discharge.  Take your pain medication as prescribed, if needed.  If narcotic pain medicine is not needed, then you may take acetaminophen (Tylenol) or ibuprofen (Advil) as needed. °2. Take your usually prescribed medications unless otherwise directed °3. If you need a refill on your pain medication, please contact your pharmacy.  They will contact our office to request authorization.  Prescriptions will not be filled after 5pm or on week-ends. °4. You should eat very light the first 24 hours after surgery, such as soup, crackers, pudding, etc.  Resume your normal diet the day after surgery. °5. Most patients will experience some swelling and bruising in the breast.  Ice packs and a good support bra will help.  Swelling and bruising can take several days to resolve.  °6. It is common to experience some constipation if taking pain medication after surgery.  Increasing fluid intake and taking a stool softener will usually help or prevent this problem from occurring.  A mild laxative (Milk of Magnesia or Miralax) should be taken according to package directions if there are no bowel movements after 48 hours. °7. Unless discharge instructions indicate otherwise, you may remove your bandages 24-48 hours after surgery, and you may shower at that time.  You may have steri-strips (small skin tapes) in place directly over the incision.  These strips should be left on the skin for 7-10 days.  If your surgeon used skin glue on the incision, you may shower in 24 hours.  The glue will flake off over the  next 2-3 weeks.  Any sutures or staples will be removed at the office during your follow-up visit. °8. ACTIVITIES:  You may resume regular daily activities (gradually increasing) beginning the next day.  Wearing a good support bra or sports bra minimizes pain and swelling.  You may have sexual intercourse when it is comfortable. °a. You may drive when you no longer are taking prescription pain medication, you can comfortably wear a seatbelt, and you can safely maneuver your car and apply brakes. °b. RETURN TO WORK:  ______________________________________________________________________________________ °9. You should see your doctor in the office for a follow-up appointment approximately two weeks after your surgery.  Your doctor’s nurse will typically make your follow-up appointment when she calls you with your pathology report.  Expect your pathology report 2-3 business days after your surgery.  You may call to check if you do not hear from us after three days. °10. OTHER INSTRUCTIONS: _______________________________________________________________________________________________ _____________________________________________________________________________________________________________________________________ °_____________________________________________________________________________________________________________________________________ °_____________________________________________________________________________________________________________________________________ ° °WHEN TO CALL YOUR DOCTOR: °1. Fever over 101.0 °2. Nausea and/or vomiting. °3. Extreme swelling or bruising. °4. Continued bleeding from incision. °5. Increased pain, redness, or drainage from the incision. ° °The clinic staff is available to answer your questions during regular business hours.  Please don’t hesitate to call and ask to speak to one of the nurses for clinical concerns.  If you have a medical emergency, go to the nearest  emergency room or call 911.  A surgeon from [facility] is always on call at the hospital. ° °For further questions, please visit [email or web address]  °

## 2020-01-15 NOTE — Op Note (Signed)
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  She had a very small early stage low-grade right breast cancer.  She was seen by medical radiation oncology and given the small size and age over 71 we opted to forego a sentinel lymph node mapping procedure after discussion with the patient.  Risk benefits and other options of treatment were discussed with the patient as well.The procedure has been discussed with the patient. Alternatives to surgery have been discussed with the patient.  Risks of surgery include bleeding,  Infection,  Seroma formation, death,  and the need for further surgery.   The patient understands and wishes to proceed.    Description of procedure: The patient was met in the holding area.  Neoprobe used to identify seed right breast upper inner quadrant.  All questions were answered.  She was taken back to the operating room.  She is placed supine upon the OR table.  After induction of general anesthesia, the right breast was prepped and draped in a sterile fashion and timeout performed.  Proper patient, site and procedure were verified.  Local anesthetic was infiltrated into the upper portion of the wrist over the lesion.  Neoprobe used and the seed was identified.  Incision made over the upper outer quadrant.  Dissection was carried down all tissue and the seed and clip were excised with a grossly negative margin.  Faxitron revealed the seed and clip to be in the specimen.  Additional inferior margin was taken after review of the films.  Hemostasis achieved.   Wound was made hemostatic with cautery.  Wound was closed with 3-0 Vicryl and 4-0 Monocryl.  Dermabond applied.  All counts were found to be correct.  Breast binder placed.  Patient was awoke extubated taken recovery in satisfactory condition. 

## 2020-01-16 DIAGNOSIS — C50911 Malignant neoplasm of unspecified site of right female breast: Secondary | ICD-10-CM | POA: Diagnosis not present

## 2020-01-21 LAB — SURGICAL PATHOLOGY

## 2020-01-22 ENCOUNTER — Encounter: Payer: Self-pay | Admitting: *Deleted

## 2020-01-25 ENCOUNTER — Encounter: Payer: Self-pay | Admitting: *Deleted

## 2020-01-30 ENCOUNTER — Other Ambulatory Visit: Payer: Self-pay | Admitting: *Deleted

## 2020-02-02 DIAGNOSIS — I10 Essential (primary) hypertension: Secondary | ICD-10-CM | POA: Diagnosis not present

## 2020-02-02 DIAGNOSIS — R748 Abnormal levels of other serum enzymes: Secondary | ICD-10-CM | POA: Diagnosis not present

## 2020-02-02 DIAGNOSIS — R809 Proteinuria, unspecified: Secondary | ICD-10-CM | POA: Diagnosis not present

## 2020-02-02 DIAGNOSIS — E782 Mixed hyperlipidemia: Secondary | ICD-10-CM | POA: Diagnosis not present

## 2020-02-03 DIAGNOSIS — Z01419 Encounter for gynecological examination (general) (routine) without abnormal findings: Secondary | ICD-10-CM | POA: Diagnosis not present

## 2020-02-04 ENCOUNTER — Ambulatory Visit: Payer: Medicare PPO | Admitting: Radiation Oncology

## 2020-02-04 ENCOUNTER — Encounter: Payer: Self-pay | Admitting: *Deleted

## 2020-02-04 ENCOUNTER — Ambulatory Visit: Payer: Medicare PPO

## 2020-02-04 ENCOUNTER — Encounter: Payer: Self-pay | Admitting: Oncology

## 2020-02-04 NOTE — Progress Notes (Signed)
Faxed medical records for second opinion per patients request to Dr. Enriqueta Shutter @ [phone], confirmation received. Patient did sign a release of information. Records will be mailed to patient as well

## 2020-02-09 ENCOUNTER — Encounter: Payer: Self-pay | Admitting: *Deleted

## 2020-02-09 DIAGNOSIS — M810 Age-related osteoporosis without current pathological fracture: Secondary | ICD-10-CM | POA: Diagnosis not present

## 2020-02-09 DIAGNOSIS — Z170 Estrogen receptor positive status [ER+]: Secondary | ICD-10-CM | POA: Diagnosis not present

## 2020-02-09 DIAGNOSIS — R748 Abnormal levels of other serum enzymes: Secondary | ICD-10-CM | POA: Diagnosis not present

## 2020-02-09 DIAGNOSIS — C50411 Malignant neoplasm of upper-outer quadrant of right female breast: Secondary | ICD-10-CM | POA: Diagnosis not present

## 2020-02-09 DIAGNOSIS — E782 Mixed hyperlipidemia: Secondary | ICD-10-CM | POA: Diagnosis not present

## 2020-02-09 DIAGNOSIS — R809 Proteinuria, unspecified: Secondary | ICD-10-CM | POA: Diagnosis not present

## 2020-02-09 DIAGNOSIS — I10 Essential (primary) hypertension: Secondary | ICD-10-CM | POA: Diagnosis not present

## 2020-02-11 ENCOUNTER — Encounter: Payer: Self-pay | Admitting: *Deleted

## 2020-02-11 ENCOUNTER — Telehealth: Payer: Self-pay | Admitting: *Deleted

## 2020-02-11 NOTE — Telephone Encounter (Signed)
Received order for oncotype testing. Requisition order faxed to [facility] and pathology.

## 2020-02-12 ENCOUNTER — Other Ambulatory Visit: Payer: Self-pay | Admitting: *Deleted

## 2020-02-12 DIAGNOSIS — Z170 Estrogen receptor positive status [ER+]: Secondary | ICD-10-CM

## 2020-02-12 NOTE — Progress Notes (Signed)
Pt request referral to be sent to [facility] for 2nd opinion regarding treatment plan.  Referral placed, MR faxed

## 2020-02-18 DIAGNOSIS — N6091 Unspecified benign mammary dysplasia of right breast: Secondary | ICD-10-CM | POA: Diagnosis not present

## 2020-02-18 DIAGNOSIS — Z170 Estrogen receptor positive status [ER+]: Secondary | ICD-10-CM | POA: Diagnosis not present

## 2020-02-18 DIAGNOSIS — C50811 Malignant neoplasm of overlapping sites of right female breast: Secondary | ICD-10-CM | POA: Diagnosis not present

## 2020-02-18 DIAGNOSIS — Z853 Personal history of malignant neoplasm of breast: Secondary | ICD-10-CM | POA: Diagnosis not present

## 2020-02-18 DIAGNOSIS — D0511 Intraductal carcinoma in situ of right breast: Secondary | ICD-10-CM | POA: Diagnosis not present

## 2020-02-18 DIAGNOSIS — N6021 Fibroadenosis of right breast: Secondary | ICD-10-CM | POA: Diagnosis not present

## 2020-02-18 DIAGNOSIS — N6011 Diffuse cystic mastopathy of right breast: Secondary | ICD-10-CM | POA: Diagnosis not present

## 2020-02-20 DIAGNOSIS — Z170 Estrogen receptor positive status [ER+]: Secondary | ICD-10-CM | POA: Diagnosis not present

## 2020-02-20 DIAGNOSIS — C50411 Malignant neoplasm of upper-outer quadrant of right female breast: Secondary | ICD-10-CM | POA: Diagnosis not present

## 2020-02-24 ENCOUNTER — Telehealth: Payer: Self-pay | Admitting: Oncology

## 2020-02-24 ENCOUNTER — Telehealth: Payer: Self-pay | Admitting: *Deleted

## 2020-02-24 DIAGNOSIS — C50411 Malignant neoplasm of upper-outer quadrant of right female breast: Secondary | ICD-10-CM | POA: Diagnosis not present

## 2020-02-24 NOTE — Telephone Encounter (Signed)
Released OV NOTE to [place] [facility] at [phone]

## 2020-02-24 NOTE — Telephone Encounter (Signed)
Received oncotype score of 15. Dr. Jana Hakim notified. Called pt with results and discussed chemotherapy is not recommended.

## 2020-03-01 ENCOUNTER — Encounter: Payer: Self-pay | Admitting: *Deleted

## 2020-03-03 ENCOUNTER — Encounter: Payer: Self-pay | Admitting: Oncology

## 2020-03-09 ENCOUNTER — Encounter (HOSPITAL_COMMUNITY): Payer: Self-pay | Admitting: Oncology

## 2020-03-15 DIAGNOSIS — C50811 Malignant neoplasm of overlapping sites of right female breast: Secondary | ICD-10-CM | POA: Diagnosis not present

## 2020-03-19 DIAGNOSIS — Z170 Estrogen receptor positive status [ER+]: Secondary | ICD-10-CM | POA: Diagnosis not present

## 2020-03-19 DIAGNOSIS — C50411 Malignant neoplasm of upper-outer quadrant of right female breast: Secondary | ICD-10-CM | POA: Diagnosis not present

## 2020-03-22 ENCOUNTER — Encounter: Payer: Self-pay | Admitting: Oncology

## 2020-03-22 ENCOUNTER — Other Ambulatory Visit: Payer: Self-pay | Admitting: Oncology

## 2020-03-22 MED ORDER — EXEMESTANE 25 MG PO TABS
25.0000 mg | ORAL_TABLET | Freq: Every day | ORAL | 4 refills | Status: DC
Start: 2020-03-22 — End: 2021-06-03

## 2020-03-22 NOTE — Progress Notes (Signed)
I called and exemestane for Deborah Nunez, at the suggestion of Dr. Derrill Center at [facility] and asked our navigators to contact her and let her know.

## 2020-03-23 ENCOUNTER — Encounter: Payer: Self-pay | Admitting: *Deleted

## 2020-03-23 DIAGNOSIS — C50811 Malignant neoplasm of overlapping sites of right female breast: Secondary | ICD-10-CM | POA: Diagnosis not present

## 2020-03-26 DIAGNOSIS — Z170 Estrogen receptor positive status [ER+]: Secondary | ICD-10-CM | POA: Diagnosis not present

## 2020-03-26 DIAGNOSIS — R5383 Other fatigue: Secondary | ICD-10-CM | POA: Diagnosis not present

## 2020-03-26 DIAGNOSIS — R232 Flushing: Secondary | ICD-10-CM | POA: Diagnosis not present

## 2020-03-26 DIAGNOSIS — C50411 Malignant neoplasm of upper-outer quadrant of right female breast: Secondary | ICD-10-CM | POA: Diagnosis not present

## 2020-03-26 DIAGNOSIS — Z79899 Other long term (current) drug therapy: Secondary | ICD-10-CM | POA: Diagnosis not present

## 2020-04-05 DIAGNOSIS — C50411 Malignant neoplasm of upper-outer quadrant of right female breast: Secondary | ICD-10-CM | POA: Diagnosis not present

## 2020-04-08 DIAGNOSIS — C50411 Malignant neoplasm of upper-outer quadrant of right female breast: Secondary | ICD-10-CM | POA: Diagnosis not present

## 2020-04-09 DIAGNOSIS — C50411 Malignant neoplasm of upper-outer quadrant of right female breast: Secondary | ICD-10-CM | POA: Diagnosis not present

## 2020-04-10 ENCOUNTER — Other Ambulatory Visit: Payer: Self-pay | Admitting: Oncology

## 2020-04-12 ENCOUNTER — Telehealth: Payer: Self-pay | Admitting: Oncology

## 2020-04-12 DIAGNOSIS — Z170 Estrogen receptor positive status [ER+]: Secondary | ICD-10-CM | POA: Diagnosis not present

## 2020-04-12 DIAGNOSIS — C50411 Malignant neoplasm of upper-outer quadrant of right female breast: Secondary | ICD-10-CM | POA: Diagnosis not present

## 2020-04-12 NOTE — Telephone Encounter (Signed)
Rescheduled apt per 8/21 sch msg - unable to reach pt . Left message with appt date and time

## 2020-04-13 DIAGNOSIS — C50411 Malignant neoplasm of upper-outer quadrant of right female breast: Secondary | ICD-10-CM | POA: Diagnosis not present

## 2020-04-14 DIAGNOSIS — C50411 Malignant neoplasm of upper-outer quadrant of right female breast: Secondary | ICD-10-CM | POA: Diagnosis not present

## 2020-04-29 ENCOUNTER — Telehealth: Payer: Medicare PPO | Admitting: Oncology

## 2020-04-29 ENCOUNTER — Encounter: Payer: Self-pay | Admitting: *Deleted

## 2020-05-06 NOTE — Progress Notes (Signed)
[facility]  [phone] [phone]     ID: Deborah Nunez DOB: 1946/01/09  MR#: [id number]  BHA#:[id number]  Patient Care Team: Merrilee Seashore, MD as PCP - General (Internal Medicine) Mauro Kaufmann, RN as Oncology Nurse Navigator Rockwell Germany, RN as Oncology Nurse Navigator Kyung Rudd, MD as Consulting Physician (Radiation Oncology) Honore Wipperfurth, Virgie Dad, MD as Consulting Physician (Oncology) Erroll Luna, MD as Consulting Physician (General Surgery) Servando Salina, MD as Consulting Physician (Obstetrics and Gynecology) Arta Silence, MD as Consulting Physician (Gastroenterology) Force, Shari Prows, DO as Referring Physician (Student) Chauncey Cruel, MD OTHER MD:  CHIEF COMPLAINT: estrogen receptor positive breast cancer  CURRENT TREATMENT: exemestane  I connected with Deborah Nunez on 05/08/20 at  9:15 AM EDT by video enabled telemedicine visit and verified that I am speaking with the correct person using two identifiers.   I discussed the limitations, risks, security and privacy concerns of performing an evaluation and management service by telemedicine and the availability of in-person appointments. I also discussed with the patient that there may be a patient responsible charge related to this service. The patient expressed understanding and agreed to proceed.   Other persons participating in the visit and their role in the encounter: none  Patient's location: home Provider's location: clinic   INTERVAL HISTORY: Deborah Nunez was contacted today for follow up of her estrogen receptor positive breast cancer. She was originally evaluated in the multidisciplinary breast cancer clinic on 12/31/2019.  She then underwent right lumpectomy on 01/15/2020 under Dr. Brantley Stage. Pathology from the procedure ([id number]) showed: invasive ductal carcinoma, grade 1, 0.8 cm; ductal carcinoma in situ, low grade; negative margins.  Oncotype DX was  obtained on the final surgical sample and the recurrence score of 15 predicts a risk of recurrence outside the breast over the next 9 years of 4%, if the patient's only systemic therapy is an antiestrogen for 5 years.  It also predicts no benefit from chemotherapy.  She began anastrozole on 02/23/2020, but this was discontinued on 03/04/2020 due to intense hot flashes.   She was referred to Dr. Ronal Fear at [facility] on 02/24/2020 for a second opinion regarding radiation therapy. She declined treatment at that time.   She also met with Dr. Derrill Nunez at [facility] on 03/19/2020 for a second opinion. They discussed her difficulty with anastrozole and he suggested a switch to exemestane. She contacted our office on 03/22/2020 via [place], and we switched her to exemestane.  Also after discussion with Dr. Derrill Nunez, she decided to pursue radiation therapy. She received treatment under Dr. Ronal Fear in 03/2020.  This was completed later that month   REVIEW OF SYSTEMS: Deborah Nunez is doing much better on exemestane.  She still has some hot flashes particularly at night.  Vaginal dryness is not a major issue.  She had the Pfizer vaccine and would like to receive the booster which I think is reasonable given her age.  She walks for exercise about 40 minutes at a time 3-4 times a week and also does extensive yard work.  She is on vitamin D.  Aside from these issues a detailed review of systems today was stable   HISTORY OF CURRENT ILLNESS: From the original intake note:  Deborah Nunez had routine screening mammography on 11/21/2019 showing a possible abnormality in the right breast. She underwent right diagnostic mammography with tomography and right breast ultrasonography at [facility] on 12/11/2019 showing: breast density category B; 0.4 cm irregular mass in right breast at 12 o'clock; right  axilla not evaluated.  Accordingly on 12/22/2019 she proceeded to biopsy of the right breast area in question. The pathology from this procedure ([id number])  showed: invasive ductal carcinoma, grade 1; ductal carcinoma in situ. Prognostic indicators significant for: estrogen receptor, 100% positive and progesterone receptor, 80% positive, both with strong staining intensity. Proliferation marker Ki67 at 1%. HER2 equivocal by immunohistochemistry (2+), but negative by fluorescent in situ hybridization with a signals ratio 1.3 and number per cell 2.15.  The patient's subsequent history is as detailed below.   PAST MEDICAL HISTORY: Past Medical History:  Diagnosis Date  . Arthritis   . Cancer [facility])    right breast, 12/2019  . Hyperlipidemia   . Hypertension   . Mucoid cyst of joint    right index  . Pulmonary embolism ([place]) 2006    PAST SURGICAL HISTORY: Past Surgical History:  Procedure Laterality Date  . BREAST LUMPECTOMY WITH RADIOACTIVE SEED LOCALIZATION Right 01/15/2020   Procedure: RIGHT BREAST LUMPECTOMY WITH RADIOACTIVE SEED LOCALIZATION;  Surgeon: Erroll Luna, MD;  Location: [place];  Service: General;  Laterality: Right;  . CYST EXCISION Right 07/20/2015   Procedure: EXCISION MUCOID CYST RIGHT INDEX ;  Surgeon: Daryll Brod, MD;  Location: [facility];  Service: Orthopedics;  Laterality: Right;  . TONSILLECTOMY    . TUBAL LIGATION      FAMILY HISTORY: Family History  Problem Relation Age of Onset  . Lung cancer Father 30  . Stomach cancer Maternal Uncle     Her father died at age 69 from lung cancer, which was diagnosed at age 63. He was a smoker. Her mother died at age 5. [name] has one brother and one sister. She also reports stomach cancer in a maternal uncle at age 55.   GYNECOLOGIC HISTORY:  No LMP recorded (lmp unknown). Patient is postmenopausal. Menarche: 74 years old Age at first live birth: 74 years old [place] P 2 LMP 1999 Contraceptive: used for approximately 6 years HRT: used from about 1995 - 2004  Hysterectomy? no BSO? 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 She is widowed. She lives at home by herself, with no pets. Son Deborah Nunez") [place], age 67, works as a Cytogeneticist in [place], [place]. Son Deborah Nunez died at age 29 from kidney failure.  She has 1 biological and one stepgrandchild.  She is [employer].    ADVANCED DIRECTIVES: Her son Deborah Nunez is her HCPOA. He can be reached at [phone].   HEALTH MAINTENANCE: Social History   Tobacco Use  . Smoking status: Former Smoker    Packs/day: 0.50    Years: 12.00    Pack years: 6.00    Types: Cigarettes    Quit date: 01/12/2020    Years since quitting: 0.3  . Smokeless tobacco: Never Used  . Tobacco comment: trying to quit since Monday 01/12/20  Vaping Use  . Vaping Use: Never used  Substance Use Topics  . Alcohol use: Yes    Comment: social  . Drug use: No     Colonoscopy: 10/2018  PAP: 01/2019  Bone density: 11/2019   No Known Allergies  Current Outpatient Medications  Medication Sig Dispense Refill  . acetaminophen (TYLENOL) 500 MG tablet Take 500 mg by mouth every 6 (six) hours as needed for headache.    . Ascorbic Acid (VITAMIN C) 1000 MG tablet Take 1,000 mg by mouth daily.    [name]  atorvastatin (LIPITOR) 20 MG tablet Take 20 mg by mouth daily.     [name] exemestane (AROMASIN) 25 MG tablet Take 1 tablet (25 mg total) by mouth daily after breakfast. 90 tablet 4  . gabapentin (NEURONTIN) 300 MG capsule Take 1 capsule (300 mg total) by mouth at bedtime. 90 capsule 4  . ibuprofen (ADVIL) 800 MG tablet Take 1 tablet (800 mg total) by mouth every 8 (eight) hours as needed. 30 tablet 0  . losartan (COZAAR) 100 MG tablet Take 100 mg by mouth daily.     . Omega-3 Fatty Acids (FISH OIL) 500 MG CAPS Take 500 mg by mouth daily.     . risedronate (ACTONEL) 35 MG tablet Take 35 mg by mouth every Sunday.     . valACYclovir (VALTREX) 1000 MG tablet Take 1,000 mg by mouth 2 (two) times daily as needed  (outbreaks).      No current facility-administered medications for this visit.    OBJECTIVE: African-American woman who appears well  There were no vitals filed for this visit.   There is no height or weight on file to calculate BMI.   Wt Readings from Last 3 Encounters:  01/15/20 190 lb (86.2 kg)  01/07/20 190 lb 1.6 oz (86.2 kg)  12/31/19 189 lb 9.6 oz (86 kg)      ECOG FS:1 - Symptomatic but completely ambulatory  Telemedicine visit 05/07/2020   LAB RESULTS:  CMP     Component Value Date/Time   NA 141 01/07/2020 1119   K 4.0 01/07/2020 1119   CL 105 01/07/2020 1119   CO2 27 01/07/2020 1119   GLUCOSE 110 (H) 01/07/2020 1119   BUN 10 01/07/2020 1119   CREATININE 0.78 01/07/2020 1119   CREATININE 0.80 12/31/2019 1230   CALCIUM 9.6 01/07/2020 1119   PROT 6.8 01/07/2020 1119   ALBUMIN 3.6 01/07/2020 1119   AST 22 01/07/2020 1119   AST 20 12/31/2019 1230   ALT 22 01/07/2020 1119   ALT 26 12/31/2019 1230   ALKPHOS 151 (H) 01/07/2020 1119   BILITOT 0.6 01/07/2020 1119   BILITOT 0.6 12/31/2019 1230   GFRNONAA >60 01/07/2020 1119   GFRNONAA >60 12/31/2019 1230   GFRAA >60 01/07/2020 1119   GFRAA >60 12/31/2019 1230    No results found for: TOTALPROTELP, ALBUMINELP, A1GS, A2GS, BETS, BETA2SER, GAMS, MSPIKE, SPEI  Lab Results  Component Value Date   WBC 8.5 01/07/2020   NEUTROABS 6.1 01/07/2020   HGB 14.7 01/07/2020   HCT 45.3 01/07/2020   MCV 103.7 (H) 01/07/2020   PLT 372 01/07/2020    No results found for: LABCA2  No components found for: UXNATF573  No results for input(s): INR in the last 168 hours.  No results found for: LABCA2  No results found for: UKG254  No results found for: YHC623  No results found for: JSE831  No results found for: CA2729  No components found for: HGQUANT  No results found for: CEA1 / No results found for: CEA1   No results found for: AFPTUMOR  No results found for: CHROMOGRNA  No results found for: KPAFRELGTCHN,  LAMBDASER, KAPLAMBRATIO (kappa/lambda light chains)  No results found for: HGBA, HGBA2QUANT, HGBFQUANT, HGBSQUAN (Hemoglobinopathy evaluation)   No results found for: LDH  No results found for: IRON, TIBC, IRONPCTSAT (Iron and TIBC)  No results found for: FERRITIN  Urinalysis No results found for: COLORURINE, APPEARANCEUR, LABSPEC, PHURINE, GLUCOSEU, HGBUR, BILIRUBINUR, KETONESUR, PROTEINUR, UROBILINOGEN, NITRITE, LEUKOCYTESUR   STUDIES: No results found.  ELIGIBLE FOR AVAILABLE RESEARCH PROTOCOL: AET  ASSESSMENT: 74 y.o. [place] woman status post right breast upper outer quadrant biopsy 12/22/2019 for a clinical T1a N0, stage IA invasive ductal carcinoma, grade 1, estrogen and progesterone receptor positive, HER-2 not amplified, with an MIB-1 of 1%  (1) status post right lumpectomy 01/15/2020 for a pT1b pNX invasive ductal carcinoma, grade 1, with negative margins  (2) adjuvant radiation to the right partial breast at [facility] completed 04/14/2020 DOSE:  26 Gy in 5 fractions (under Lauree Chandler).  (3) started anastrozole 02/23/2020--tolerated very poorly  (a) patient has a remote history of pulmonary embolus and is not a candidate for tamoxifen  (b) switched to exemestane 03/22/2020, with better tolerance.  (c) DEXA scan at [facility] April 2021   PLAN: Marbeth is tolerating exemestane much better and the plan will be to continue that a minimum of 5 years.  I think she will benefit from low-dose gabapentin at bedtime we discussed the possible toxicity side effects and complications of this agent and I am putting in the prescription for her.  We will check her DEXA scan obtained April 2021.  She has a good walking program and is taking vitamin D.  We discussed weight reduction strategies since that is a concern of hers  She will have her next mammogram at [facility] in April 2022 and see me in May.  She knows to call for any other issue that may develop before the next  visit.    Virgie Dad. Bladimir Auman, MD 05/08/2020 8:52 AM Medical Oncology and Hematology [facility] [place], [place] [address] Tel. [phone]    Fax. [phone]   This document serves as a record of services personally performed by Lurline Del, MD. It was created on his behalf by Wilburn Mylar, a trained medical scribe. The creation of this record is based on the scribe's personal observations and the provider's statements to them.   I, Lurline Del MD, have reviewed the above documentation for accuracy and completeness, and I agree with the above.   *Total Encounter Time as defined by the Centers for Medicare and Medicaid Services includes, in addition to the face-to-face time of a patient visit (documented in the note above) non-face-to-face time: obtaining and reviewing outside history, ordering and reviewing medications, tests or procedures, care coordination (communications with other health care professionals or caregivers) and documentation in the medical record.

## 2020-05-07 ENCOUNTER — Inpatient Hospital Stay: Payer: Medicare PPO | Attending: Oncology | Admitting: Oncology

## 2020-05-07 DIAGNOSIS — Z170 Estrogen receptor positive status [ER+]: Secondary | ICD-10-CM

## 2020-05-07 DIAGNOSIS — C50411 Malignant neoplasm of upper-outer quadrant of right female breast: Secondary | ICD-10-CM

## 2020-05-07 DIAGNOSIS — Z86711 Personal history of pulmonary embolism: Secondary | ICD-10-CM | POA: Insufficient documentation

## 2020-05-07 DIAGNOSIS — Z79811 Long term (current) use of aromatase inhibitors: Secondary | ICD-10-CM | POA: Insufficient documentation

## 2020-05-07 DIAGNOSIS — Z923 Personal history of irradiation: Secondary | ICD-10-CM | POA: Insufficient documentation

## 2020-05-07 MED ORDER — GABAPENTIN 300 MG PO CAPS: 300.0000 mg | ORAL_CAPSULE | Freq: Every day | ORAL | 4 refills | Status: AC

## 2020-05-10 ENCOUNTER — Encounter: Payer: Self-pay | Admitting: *Deleted

## 2020-05-17 DIAGNOSIS — Z170 Estrogen receptor positive status [ER+]: Secondary | ICD-10-CM | POA: Diagnosis not present

## 2020-05-17 DIAGNOSIS — C50911 Malignant neoplasm of unspecified site of right female breast: Secondary | ICD-10-CM | POA: Diagnosis not present

## 2020-05-21 DIAGNOSIS — Z23 Encounter for immunization: Secondary | ICD-10-CM | POA: Diagnosis not present

## 2020-08-02 DIAGNOSIS — I10 Essential (primary) hypertension: Secondary | ICD-10-CM | POA: Diagnosis not present

## 2020-08-02 DIAGNOSIS — R748 Abnormal levels of other serum enzymes: Secondary | ICD-10-CM | POA: Diagnosis not present

## 2020-08-02 DIAGNOSIS — Z0000 Encounter for general adult medical examination without abnormal findings: Secondary | ICD-10-CM | POA: Diagnosis not present

## 2020-08-02 DIAGNOSIS — E782 Mixed hyperlipidemia: Secondary | ICD-10-CM | POA: Diagnosis not present

## 2020-08-02 DIAGNOSIS — M810 Age-related osteoporosis without current pathological fracture: Secondary | ICD-10-CM | POA: Diagnosis not present

## 2020-08-02 DIAGNOSIS — N390 Urinary tract infection, site not specified: Secondary | ICD-10-CM | POA: Diagnosis not present

## 2020-08-09 DIAGNOSIS — I10 Essential (primary) hypertension: Secondary | ICD-10-CM | POA: Diagnosis not present

## 2020-08-09 DIAGNOSIS — E559 Vitamin D deficiency, unspecified: Secondary | ICD-10-CM | POA: Diagnosis not present

## 2020-08-09 DIAGNOSIS — N182 Chronic kidney disease, stage 2 (mild): Secondary | ICD-10-CM | POA: Diagnosis not present

## 2020-08-09 DIAGNOSIS — R809 Proteinuria, unspecified: Secondary | ICD-10-CM | POA: Diagnosis not present

## 2020-08-09 DIAGNOSIS — R748 Abnormal levels of other serum enzymes: Secondary | ICD-10-CM | POA: Diagnosis not present

## 2020-08-09 DIAGNOSIS — M810 Age-related osteoporosis without current pathological fracture: Secondary | ICD-10-CM | POA: Diagnosis not present

## 2020-08-09 DIAGNOSIS — E782 Mixed hyperlipidemia: Secondary | ICD-10-CM | POA: Diagnosis not present

## 2020-08-09 DIAGNOSIS — Z0000 Encounter for general adult medical examination without abnormal findings: Secondary | ICD-10-CM | POA: Diagnosis not present

## 2020-08-09 DIAGNOSIS — I872 Venous insufficiency (chronic) (peripheral): Secondary | ICD-10-CM | POA: Diagnosis not present

## 2020-09-28 DIAGNOSIS — E782 Mixed hyperlipidemia: Secondary | ICD-10-CM | POA: Diagnosis not present

## 2020-09-28 DIAGNOSIS — N182 Chronic kidney disease, stage 2 (mild): Secondary | ICD-10-CM | POA: Diagnosis not present

## 2020-09-28 DIAGNOSIS — I10 Essential (primary) hypertension: Secondary | ICD-10-CM | POA: Diagnosis not present

## 2020-10-04 DIAGNOSIS — E782 Mixed hyperlipidemia: Secondary | ICD-10-CM | POA: Diagnosis not present

## 2020-10-04 DIAGNOSIS — N182 Chronic kidney disease, stage 2 (mild): Secondary | ICD-10-CM | POA: Diagnosis not present

## 2020-10-04 DIAGNOSIS — I10 Essential (primary) hypertension: Secondary | ICD-10-CM | POA: Diagnosis not present

## 2020-12-17 DIAGNOSIS — C50411 Malignant neoplasm of upper-outer quadrant of right female breast: Secondary | ICD-10-CM | POA: Diagnosis not present

## 2020-12-17 DIAGNOSIS — R928 Other abnormal and inconclusive findings on diagnostic imaging of breast: Secondary | ICD-10-CM | POA: Diagnosis not present

## 2020-12-17 DIAGNOSIS — Z170 Estrogen receptor positive status [ER+]: Secondary | ICD-10-CM | POA: Diagnosis not present

## 2020-12-24 ENCOUNTER — Other Ambulatory Visit: Payer: Self-pay | Admitting: *Deleted

## 2020-12-24 DIAGNOSIS — Z170 Estrogen receptor positive status [ER+]: Secondary | ICD-10-CM

## 2020-12-24 DIAGNOSIS — C50411 Malignant neoplasm of upper-outer quadrant of right female breast: Secondary | ICD-10-CM

## 2020-12-26 NOTE — Progress Notes (Signed)
[place]  Telephone:([phone] Fax:([phone]) [phone]     ID: Deborah Nunez DOB: 1946/07/03  MR#: [id number]  OTL#:[id number]  Patient Care Team: Merrilee Seashore, MD as PCP - General (Internal Medicine) Mauro Kaufmann, RN as Oncology Nurse Navigator Rockwell Germany, RN as Oncology Nurse Navigator Kyung Rudd, MD as Consulting Physician (Radiation Oncology) Raeford Brandenburg, Virgie Dad, MD as Consulting Physician (Oncology) Erroll Luna, MD as Consulting Physician (General Surgery) Servando Salina, MD as Consulting Physician (Obstetrics and Gynecology) Arta Silence, MD as Consulting Physician (Gastroenterology) Force, Shari Prows, DO as Referring Physician (Student) Chauncey Cruel, MD OTHER MD:  CHIEF COMPLAINT: estrogen receptor positive breast cancer  CURRENT TREATMENT: exemestane   INTERVAL HISTORY: Tarry returns today for follow up of her estrogen receptor positive breast cancer.  She was switched to exemestane on 03/22/2020.  She is tolerating this much better than the other antiestrogens she tried previously.  She is able to obtain it at a good price.  Since her last visit, she underwent bilateral diagnostic mammography with tomography at [facility] on 12/17/2020 showing: breasts with scattered areas of fibroglandular density; no evidence of malignancy in either breast.   REVIEW OF SYSTEMS: Angelic is under some stress as her 74 year old sister who used to live in [place] has moved in with her.  She tells me the sister has "stage II Alzheimer's", is going through a very messy divorce, and it is not clear what finds she is going to end up with so it is not clear whether she will be able to live independently here or not.  Aside from the stress, Deborah Nunez walks 45 to 50 minutes at least 3 times a week.  She has lost weight by eating "better food", not cutting out but cutting down on the carbs.  She feels more energetic and generally feels better.  A detailed  review of systems was otherwise stable.   COVID 19 VACCINATION STATUS: [place] x2   HISTORY OF CURRENT ILLNESS: From the original intake note:  Mela Perham had routine screening mammography on 11/21/2019 showing a possible abnormality in the right breast. She underwent right diagnostic mammography with tomography and right breast ultrasonography at [facility] on 12/11/2019 showing: breast density category B; 0.4 cm irregular mass in right breast at 12 o'clock; right axilla not evaluated.  Accordingly on 12/22/2019 she proceeded to biopsy of the right breast area in question. The pathology from this procedure ([id number]) showed: invasive ductal carcinoma, grade 1; ductal carcinoma in situ. Prognostic indicators significant for: estrogen receptor, 100% positive and progesterone receptor, 80% positive, both with strong staining intensity. Proliferation marker Ki67 at 1%. HER2 equivocal by immunohistochemistry (2+), but negative by fluorescent in situ hybridization with a signals ratio 1.3 and number per cell 2.15.  The patient's subsequent history is as detailed below.   PAST MEDICAL HISTORY: Past Medical History:  Diagnosis Date  . Arthritis   . Cancer [facility])    right breast, 12/2019  . Hyperlipidemia   . Hypertension   . Mucoid cyst of joint    right index  . Pulmonary embolism ([place]) 2006    PAST SURGICAL HISTORY: Past Surgical History:  Procedure Laterality Date  . BREAST LUMPECTOMY WITH RADIOACTIVE SEED LOCALIZATION Right 01/15/2020   Procedure: RIGHT BREAST LUMPECTOMY WITH RADIOACTIVE SEED LOCALIZATION;  Surgeon: Erroll Luna, MD;  Location: [facility];  Service: General;  Laterality: Right;  . CYST EXCISION Right 07/20/2015   Procedure: EXCISION MUCOID CYST RIGHT INDEX ;  Surgeon: Daryll Brod, MD;  Location: [facility];  Service: Orthopedics;  Laterality: Right;  . TONSILLECTOMY    . TUBAL LIGATION      FAMILY HISTORY: Family History  Problem Relation Age of Onset  . Lung  cancer Father 40  . Stomach cancer Maternal Uncle    Her father died at age 58 from lung cancer, which was diagnosed at age 66. He was a smoker. Her mother died at age 6. Deborah Nunez has one brother and one sister. She also reports stomach cancer in a maternal uncle at age 79.   GYNECOLOGIC HISTORY:  No LMP recorded (lmp unknown). Patient is postmenopausal. Menarche: 75 years old Age at first live birth: 75 years old [place] P 2 LMP 1999 Contraceptive: used for approximately 6 years HRT: used from about 1995 - 2004  Hysterectomy? no BSO? 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 She is widowed. She lives at home by herself, with no pets. Son Derenda Mis [facility] [place], age 74, works as a Cytogeneticist in [place], [place]. Son Fuller [place] died at age 73 from kidney failure.  She has 1 biological and one stepgrandchild.  She is [employer].    ADVANCED DIRECTIVES: Her son Wynell Balloon is her HCPOA. He can be reached at [phone].   HEALTH MAINTENANCE: Social History   Tobacco Use  . Smoking status: Former Smoker    Packs/day: 0.50    Years: 12.00    Pack years: 6.00    Types: Cigarettes    Quit date: 01/12/2020    Years since quitting: 0.9  . Smokeless tobacco: Never Used  . Tobacco comment: trying to quit since Monday 01/12/20  Vaping Use  . Vaping Use: Never used  Substance Use Topics  . Alcohol use: Yes    Comment: social  . Drug use: No     Colonoscopy: 10/2018  PAP: 01/2019  Bone density: 11/2019   No Known Allergies  Current Outpatient Medications  Medication Sig Dispense Refill  . acetaminophen (TYLENOL) 500 MG tablet Take 500 mg by mouth every 6 (six) hours as needed for headache.    . Ascorbic Acid (VITAMIN C) 1000 MG tablet Take 1,000 mg by mouth daily.    [name] atorvastatin (LIPITOR) 20 MG tablet Take 20 mg by mouth daily.     [name] exemestane (AROMASIN) 25 MG  tablet Take 1 tablet (25 mg total) by mouth daily after breakfast. 90 tablet 4  . gabapentin (NEURONTIN) 300 MG capsule Take 1 capsule (300 mg total) by mouth at bedtime. 90 capsule 4  . ibuprofen (ADVIL) 800 MG tablet Take 1 tablet (800 mg total) by mouth every 8 (eight) hours as needed. 30 tablet 0  . losartan (COZAAR) 100 MG tablet Take 100 mg by mouth daily.     . Omega-3 Fatty Acids (FISH OIL) 500 MG CAPS Take 500 mg by mouth daily.     . risedronate (ACTONEL) 35 MG tablet Take 35 mg by mouth every Sunday.     . valACYclovir (VALTREX) 1000 MG tablet Take 1,000 mg by mouth 2 (two) times daily as needed (outbreaks).      No current facility-administered medications for this visit.    OBJECTIVE: African-American woman who appears well  Vitals:   12/27/20 1124  BP: 119/74  Pulse: 86  Resp: 18  Temp: (!) 97.5 F (36.4 C)  SpO2: 99%     Body mass index is  29.67 kg/m.   Wt Readings from Last 3 Encounters:  12/27/20 183 lb 12.8 oz (83.4 kg)  01/15/20 190 lb (86.2 kg)  01/07/20 190 lb 1.6 oz (86.2 kg)      ECOG FS:1 - Symptomatic but completely ambulatory  Sclerae unicteric, EOMs intact Wearing a mask No cervical or supraclavicular adenopathy Lungs no rales or rhonchi Heart regular rate and rhythm Abd soft, nontender, positive bowel sounds MSK no focal spinal tenderness, no upper extremity lymphedema Neuro: nonfocal, well oriented, appropriate affect Breasts: The right breast is status post lumpectomy and radiation.  There is no evidence of local recurrence.  The left breast is benign.  Both axillae are benign.   LAB RESULTS:  CMP     Component Value Date/Time   NA 142 12/27/2020 1100   K 3.4 (L) 12/27/2020 1100   CL 103 12/27/2020 1100   CO2 27 12/27/2020 1100   GLUCOSE 132 (H) 12/27/2020 1100   BUN 12 12/27/2020 1100   CREATININE 0.95 12/27/2020 1100   CALCIUM 9.7 12/27/2020 1100   PROT 7.0 12/27/2020 1100   ALBUMIN 3.9 12/27/2020 1100   AST 18 12/27/2020 1100    ALT 15 12/27/2020 1100   ALKPHOS 150 (H) 12/27/2020 1100   BILITOT 0.7 12/27/2020 1100   GFRNONAA >60 12/27/2020 1100   GFRAA >60 01/07/2020 1119   GFRAA >60 12/31/2019 1230    No results found for: TOTALPROTELP, ALBUMINELP, A1GS, A2GS, BETS, BETA2SER, GAMS, MSPIKE, SPEI  Lab Results  Component Value Date   WBC 9.3 12/27/2020   NEUTROABS 7.1 12/27/2020   HGB 13.7 12/27/2020   HCT 41.4 12/27/2020   MCV 102.2 (H) 12/27/2020   PLT 341 12/27/2020    No results found for: LABCA2  No components found for: UXLKGM010  No results for input(s): INR in the last 168 hours.  No results found for: LABCA2  No results found for: UVO536  No results found for: UYQ034  No results found for: VQQ595  No results found for: CA2729  No components found for: HGQUANT  No results found for: CEA1 / No results found for: CEA1   No results found for: AFPTUMOR  No results found for: CHROMOGRNA  No results found for: KPAFRELGTCHN, LAMBDASER, KAPLAMBRATIO (kappa/lambda light chains)  No results found for: HGBA, HGBA2QUANT, HGBFQUANT, HGBSQUAN (Hemoglobinopathy evaluation)   No results found for: LDH  No results found for: IRON, TIBC, IRONPCTSAT (Iron and TIBC)  No results found for: FERRITIN  Urinalysis No results found for: COLORURINE, APPEARANCEUR, LABSPEC, PHURINE, GLUCOSEU, HGBUR, BILIRUBINUR, KETONESUR, PROTEINUR, UROBILINOGEN, NITRITE, LEUKOCYTESUR   STUDIES: No results found. Impression  No mammographic evidence of malignancy.   RECOMMENDATION:  Mammo Diagnostic in 12 Months is recommended for both breasts.   The exam was electronically reviewed by a staff physician.   The findings and recommendations have been discussed with the patient, and  the ordering physician has been notified through the Epic system.   BI-RADS: 2 - Benign  Narrative  EXAM:  MAMMO DIAGNOSTIC BREAST WITH TOMOSYNTHESIS BILATERAL 12/17/2020 10:30 AM   INDICATION:  history of right breast  cancer, s/p lump/RT for surveillance   COMPARISON:  Compared to: 12/11/2019 Mammo diagnostic, 11/21/2019 Mammography screening  bilateral, 11/01/2018 Mammography screening bilateral, and 10/19/2017  Mammography screening bilateral    TECHNIQUE:  Tomosynthesis images were obtained as part of this exam.   FINDINGS:  The breasts have scattered areas of fibroglandular density.   At the surgical site in the right upper breast near 12:00, there  is  density and architectural distortion, consistent with a post surgical  scar. The lumpectomy site shows no mammographic evidence of recurrent  malignancy.   An area of apparent architectural distortion seen in the medial right  breast on CC view resolved on additional views, compatible with  superimposition of normal breast tissue.   Multiple small subcentimeter bilateral round and oval masses are present  and have fluctuated in size compared to prior studies. These are most  consistent with cysts.    There are no suspicious masses, calcifications, or other findings in  either breast. Exam End: 12/17/20 10:38 AM   Specimen Collected: 12/17/20 10:32 AM Last Resulted: 12/17/20 11:32 AM  Received From: [facility]  Result Received: 12/24/20 10:31 AM  View Encounter      ELIGIBLE FOR AVAILABLE RESEARCH PROTOCOL: AET  ASSESSMENT: 75 y.o.  woman status post right breast upper outer quadrant biopsy 12/22/2019 for a clinical T1a N0, stage IA invasive ductal carcinoma, grade 1, estrogen and progesterone receptor positive, HER-2 not amplified, with an MIB-1 of 1%  (1) status post right lumpectomy 01/15/2020 for a pT1b pNX invasive ductal carcinoma, grade 1, with negative margins  (2) adjuvant radiation to the right partial breast at [facility] completed 04/14/2020 DOSE:  26 Gy in 5 fractions (under Lauree Chandler).  (3) started anastrozole 02/23/2020--tolerated very poorly  (a) patient has a remote history of pulmonary  embolus and is not a candidate for tamoxifen  (b) switched to exemestane 03/22/2020, with better tolerance.  (c) D[facility]A scan at [facility] 11/21/2019 showed a T score of -1.9   PLAN: Peola is now a year out from definitive surgery for her breast cancer with no evidence of disease recurrence.  This is favorable.  She is tolerating exemestane well and the plan will be to continue that a minimum of 5 years.  We discussed her situation with her sister.  Deborah Nunez is already aware of "the 36-hour day" and has a copy.  She is going to try to get copies for other family members who also may be involved in her sister's care at some point.  She tells me that she would like to see Dr. Garwin Brothers as she has for many years but that Dr. Garwin Brothers is now working with a different group that does not accept she will let us insurance.  I am not sure there is anything we can do about that but she can certainly bring it up to Dr. Harvie Bridge attention to see if she is able to make an exception in her case  Otherwise I will see she will again in 6 months.  She knows to call for any other issue that may develop before then.  Total encounter time 25 minutes.Sarajane Jews C. Shavette Shoaff, MD 12/27/2020 2:00 PM Medical Oncology and Hematology [facility] [place], [place] [address] Tel. [phone]    Fax. [phone]   This document serves as a record of services personally performed by Lurline Del, MD. It was created on his behalf by Wilburn Mylar, a trained medical scribe. The creation of this record is based on the scribe's personal observations and the provider's statements to them.   I, Lurline Del MD, have reviewed the above documentation for accuracy and completeness, and I agree with the above.   *Total Encounter Time as defined by the Centers for Medicare and Medicaid Services includes, in addition to the face-to-face time of a patient visit (documented in the note above)  non-face-to-face  time: obtaining and reviewing outside history, ordering and reviewing medications, tests or procedures, care coordination (communications with other health care professionals or caregivers) and documentation in the medical record.

## 2020-12-27 ENCOUNTER — Telehealth: Payer: Self-pay | Admitting: Oncology

## 2020-12-27 ENCOUNTER — Inpatient Hospital Stay: Payer: Medicare PPO | Attending: Oncology | Admitting: Oncology

## 2020-12-27 ENCOUNTER — Other Ambulatory Visit: Payer: Self-pay

## 2020-12-27 ENCOUNTER — Inpatient Hospital Stay: Payer: Medicare PPO

## 2020-12-27 VITALS — BP 119/74 | HR 86 | Temp 97.5°F | Resp 18 | Ht 66.0 in | Wt 183.8 lb

## 2020-12-27 DIAGNOSIS — Z79811 Long term (current) use of aromatase inhibitors: Secondary | ICD-10-CM | POA: Insufficient documentation

## 2020-12-27 DIAGNOSIS — Z923 Personal history of irradiation: Secondary | ICD-10-CM | POA: Diagnosis not present

## 2020-12-27 DIAGNOSIS — Z170 Estrogen receptor positive status [ER+]: Secondary | ICD-10-CM | POA: Insufficient documentation

## 2020-12-27 DIAGNOSIS — C50411 Malignant neoplasm of upper-outer quadrant of right female breast: Secondary | ICD-10-CM

## 2020-12-27 LAB — CBC WITH DIFFERENTIAL (CANCER CENTER ONLY)
Abs Immature Granulocytes: 0.02 10*3/uL (ref 0.00–0.07)
Basophils Absolute: 0 10*3/uL (ref 0.0–0.1)
Basophils Relative: 0 %
Eosinophils Absolute: 0.1 10*3/uL (ref 0.0–0.5)
Eosinophils Relative: 1 %
HCT: 41.4 % (ref 36.0–46.0)
Hemoglobin: 13.7 g/dL (ref 12.0–15.0)
Immature Granulocytes: 0 %
Lymphocytes Relative: 14 %
Lymphs Abs: 1.3 10*3/uL (ref 0.7–4.0)
MCH: 33.8 pg (ref 26.0–34.0)
MCHC: 33.1 g/dL (ref 30.0–36.0)
MCV: 102.2 fL — ABNORMAL HIGH (ref 80.0–100.0)
Monocytes Absolute: 0.7 10*3/uL (ref 0.1–1.0)
Monocytes Relative: 7 %
Neutro Abs: 7.1 10*3/uL (ref 1.7–7.7)
Neutrophils Relative %: 78 %
Platelet Count: 341 10*3/uL (ref 150–400)
RBC: 4.05 MIL/uL (ref 3.87–5.11)
RDW: 11 % — ABNORMAL LOW (ref 11.5–15.5)
WBC Count: 9.3 10*3/uL (ref 4.0–10.5)
nRBC: 0 % (ref 0.0–0.2)

## 2020-12-27 LAB — CMP (CANCER CENTER ONLY)
ALT: 15 U/L (ref 0–44)
AST: 18 U/L (ref 15–41)
Albumin: 3.9 g/dL (ref 3.5–5.0)
Alkaline Phosphatase: 150 U/L — ABNORMAL HIGH (ref 38–126)
Anion gap: 12 (ref 5–15)
BUN: 12 mg/dL (ref 8–23)
CO2: 27 mmol/L (ref 22–32)
Calcium: 9.7 mg/dL (ref 8.9–10.3)
Chloride: 103 mmol/L (ref 98–111)
Creatinine: 0.95 mg/dL (ref 0.44–1.00)
GFR, Estimated: >60 mL/min (ref >60)
Glucose, Bld: 132 mg/dL — ABNORMAL HIGH (ref 70–99)
Potassium: 3.4 mmol/L — ABNORMAL LOW (ref 3.5–5.1)
Sodium: 142 mmol/L (ref 135–145)
Total Bilirubin: 0.7 mg/dL (ref 0.3–1.2)
Total Protein: 7 g/dL (ref 6.5–8.1)

## 2020-12-27 NOTE — Telephone Encounter (Signed)
Scheduled per los. Gave avs and calendar  

## 2021-01-26 DIAGNOSIS — Z124 Encounter for screening for malignant neoplasm of cervix: Secondary | ICD-10-CM | POA: Diagnosis not present

## 2021-01-26 DIAGNOSIS — Z853 Personal history of malignant neoplasm of breast: Secondary | ICD-10-CM | POA: Diagnosis not present

## 2021-01-26 DIAGNOSIS — I2699 Other pulmonary embolism without acute cor pulmonale: Secondary | ICD-10-CM | POA: Diagnosis not present

## 2021-01-26 DIAGNOSIS — I10 Essential (primary) hypertension: Secondary | ICD-10-CM | POA: Diagnosis not present

## 2021-01-26 DIAGNOSIS — Z01419 Encounter for gynecological examination (general) (routine) without abnormal findings: Secondary | ICD-10-CM | POA: Diagnosis not present

## 2021-01-26 DIAGNOSIS — Z780 Asymptomatic menopausal state: Secondary | ICD-10-CM | POA: Diagnosis not present

## 2021-02-26 ENCOUNTER — Encounter (HOSPITAL_COMMUNITY): Payer: Self-pay

## 2021-03-18 DIAGNOSIS — C50411 Malignant neoplasm of upper-outer quadrant of right female breast: Secondary | ICD-10-CM | POA: Diagnosis not present

## 2021-03-18 DIAGNOSIS — Z170 Estrogen receptor positive status [ER+]: Secondary | ICD-10-CM | POA: Diagnosis not present

## 2021-05-02 DIAGNOSIS — E782 Mixed hyperlipidemia: Secondary | ICD-10-CM | POA: Diagnosis not present

## 2021-05-09 DIAGNOSIS — N182 Chronic kidney disease, stage 2 (mild): Secondary | ICD-10-CM | POA: Diagnosis not present

## 2021-05-09 DIAGNOSIS — M25562 Pain in left knee: Secondary | ICD-10-CM | POA: Diagnosis not present

## 2021-05-09 DIAGNOSIS — E782 Mixed hyperlipidemia: Secondary | ICD-10-CM | POA: Diagnosis not present

## 2021-05-09 DIAGNOSIS — I10 Essential (primary) hypertension: Secondary | ICD-10-CM | POA: Diagnosis not present

## 2021-06-03 ENCOUNTER — Other Ambulatory Visit: Payer: Self-pay | Admitting: Oncology

## 2021-07-01 ENCOUNTER — Other Ambulatory Visit: Payer: Self-pay | Admitting: *Deleted

## 2021-07-01 DIAGNOSIS — Z170 Estrogen receptor positive status [ER+]: Secondary | ICD-10-CM

## 2021-07-01 DIAGNOSIS — C50411 Malignant neoplasm of upper-outer quadrant of right female breast: Secondary | ICD-10-CM

## 2021-07-03 NOTE — Progress Notes (Signed)
[place]  Telephone:(336) [phone] [phone]) [phone]     ID: Deborah Nunez DOB: Jun 28, 1946  MR#: [id number]  QIO#:[id number]  Patient Care Team: Deborah Seashore, MD as PCP - General (Internal Medicine) Deborah Kaufmann, RN as Oncology Nurse Navigator Deborah Germany, RN as Oncology Nurse Navigator Deborah Rudd, MD as Consulting Physician (Radiation Oncology) Deborah Nunez, Virgie Dad, MD as Consulting Physician (Oncology) Deborah Luna, MD as Consulting Physician (General Surgery) Deborah Salina, MD as Consulting Physician (Obstetrics and Gynecology) Deborah Silence, MD as Consulting Physician (Gastroenterology) Nunez, Deborah Prows, DO as Referring Physician (Student) Deborah Cruel, MD OTHER MD:  CHIEF COMPLAINT: estrogen receptor positive breast cancer  CURRENT TREATMENT: exemestane   INTERVAL HISTORY: Deborah Nunez returns today for follow up of her estrogen receptor positive breast cancer.  She was switched to exemestane on 03/22/2020.  She is tolerating this much better than the other antiestrogens she tried previously.  She is able to obtain it at a good price.  Her most recent mammogram at [facility] on 12/17/2020 was benign.   REVIEW OF SYSTEMS: Deborah Nunez tells me her older sister, who used to be an executive in [place], has developed Alzheimer's and is now living with her.  Deborah Nunez is doing a very good job of this very difficult situation and has already read up on her sister's condition and started looking for a place where she might go if the problem becomes unmanageable at home but at this point she, Deborah Nunez, is hoping her sister will be able to stay with her for a long time.  Deborah Nunez is exercising regularly and has a good diet.  A detailed review of systems today was otherwise stable   COVID 19 VACCINATION STATUS: Pfizer x3; has never had COVID   HISTORY OF CURRENT ILLNESS: From the original intake note:  Deborah Nunez had routine screening mammography on 11/21/2019  showing a possible abnormality in the right breast. She underwent right diagnostic mammography with tomography and right breast ultrasonography at [facility] on 12/11/2019 showing: breast density category B; 0.4 cm irregular mass in right breast at 12 o'clock; right axilla not evaluated.  Accordingly on 12/22/2019 she proceeded to biopsy of the right breast area in question. The pathology from this procedure ([id number]) showed: invasive ductal carcinoma, grade 1; ductal carcinoma in situ. Prognostic indicators significant for: estrogen receptor, 100% positive and progesterone receptor, 80% positive, both with strong staining intensity. Proliferation marker Ki67 at 1%. HER2 equivocal by immunohistochemistry (2+), but negative by fluorescent in situ hybridization with a signals ratio 1.3 and number per cell 2.15.  The patient's subsequent history is as detailed below.   PAST MEDICAL HISTORY: Past Medical History:  Diagnosis Date   Arthritis    Cancer ([place])    right breast, 12/2019   Hyperlipidemia    Hypertension    Mucoid cyst of joint    right index   Pulmonary embolism ([place]) 2006    PAST SURGICAL HISTORY: Past Surgical History:  Procedure Laterality Date   BREAST LUMPECTOMY WITH RADIOACTIVE SEED LOCALIZATION Right 01/15/2020   Procedure: RIGHT BREAST LUMPECTOMY WITH RADIOACTIVE SEED LOCALIZATION;  Surgeon: Deborah Luna, MD;  Location: [place];  Service: General;  Laterality: Right;   CYST EXCISION Right 07/20/2015   Procedure: EXCISION MUCOID CYST RIGHT INDEX ;  Surgeon: Daryll Brod, MD;  Location: [place];  Service: Orthopedics;  Laterality: Right;   TONSILLECTOMY     TUBAL LIGATION      FAMILY HISTORY: Family History  Problem Relation Age  of Onset   Lung cancer Father 23   Stomach cancer Maternal Uncle    Her father died at age 65 from lung cancer, which was diagnosed at age 30. He was a smoker. Her mother died at age 71. Deborah Nunez has one brother and one sister. She also  reports stomach cancer in a maternal uncle at age 75.   GYNECOLOGIC HISTORY:  No LMP recorded (lmp unknown). Patient is postmenopausal. Menarche: 75 years old Age at first live birth: 75 years old [place] P 2 LMP 1999 Contraceptive: used for approximately 6 years HRT: used from about 1995 - 2004  Hysterectomy? no BSO? 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 She is widowed. She lives at home by herself, with no pets. Son Deborah Nunez, age 4, works as a Cytogeneticist in [place], [place]. Son Deborah Nunez died at age 75 from kidney failure.  She has 1 biological and one stepgrandchild.  She is [employer].    ADVANCED DIRECTIVES: Her son Deborah Nunez is her HCPOA. He can be reached at [phone].   HEALTH MAINTENANCE: Social History   Tobacco Use   Smoking status: Former    Packs/day: 0.50    Years: 12.00    Pack years: 6.00    Types: Cigarettes    Quit date: 01/12/2020    Years since quitting: 1.4   Smokeless tobacco: Never   Tobacco comments:    trying to quit since Monday 01/12/20  Vaping Use   Vaping Use: Never used  Substance Use Topics   Alcohol use: Yes    Comment: social   Drug use: No     Colonoscopy: 10/2018  PAP: 01/2019  Bone density: 11/2019   No Known Allergies  Current Outpatient Medications  Medication Sig Dispense Refill   acetaminophen (TYLENOL) 500 MG tablet Take 500 mg by mouth every 6 (six) hours as needed for headache.     Ascorbic Acid (VITAMIN C) 1000 MG tablet Take 1,000 mg by mouth daily.     atorvastatin (LIPITOR) 20 MG tablet Take 20 mg by mouth daily.      exemestane (AROMASIN) 25 MG tablet Take 1 tablet (25 mg total) by mouth daily after breakfast. 90 tablet 4   gabapentin (NEURONTIN) 300 MG capsule Take 1 capsule (300 mg total) by mouth at bedtime. 90 capsule 4   ibuprofen (ADVIL) 800 MG tablet Take 1 tablet (800 mg  total) by mouth every 8 (eight) hours as needed. 30 tablet 0   losartan (COZAAR) 100 MG tablet Take 100 mg by mouth daily.      Omega-3 Fatty Acids (FISH OIL) 500 MG CAPS Take 500 mg by mouth daily.      risedronate (ACTONEL) 35 MG tablet Take 35 mg by mouth every Sunday.      valACYclovir (VALTREX) 1000 MG tablet Take 1,000 mg by mouth 2 (two) times daily as needed (outbreaks).      No current facility-administered medications for this visit.    OBJECTIVE: African-American woman in no acute distress  Vitals:   07/04/21 1209  BP: 128/68  Pulse: 67  Resp: 18  Temp: 97.9 F (36.6 C)  SpO2: 100%      Body mass index is 27.73 kg/m.   Wt Readings from Last 3 Encounters:  07/04/21 171 lb 12.8 oz (77.9 kg)  12/27/20 183 lb 12.8 oz (83.4 kg)  01/15/20 190 lb (86.2  kg)     ECOG FS:1 - Symptomatic but completely ambulatory  Sclerae unicteric, EOMs intact Wearing a mask No cervical or supraclavicular adenopathy Lungs no rales or rhonchi Heart regular rate and rhythm Abd soft, nontender, positive bowel sounds MSK no focal spinal tenderness, no upper extremity lymphedema Neuro: nonfocal, well oriented, appropriate affect Breasts: The right breast has undergone lumpectomy followed by radiation.  There is no evidence of local recurrence.  The left breast and both axillae are benign.   LAB RESULTS:  CMP     Component Value Date/Time   NA 141 07/04/2021 1131   K 3.5 07/04/2021 1131   CL 106 07/04/2021 1131   CO2 25 07/04/2021 1131   GLUCOSE 88 07/04/2021 1131   BUN 12 07/04/2021 1131   CREATININE 0.84 07/04/2021 1131   CALCIUM 9.5 07/04/2021 1131   PROT 7.2 07/04/2021 1131   ALBUMIN 3.9 07/04/2021 1131   AST 19 07/04/2021 1131   ALT 19 07/04/2021 1131   ALKPHOS 161 (H) 07/04/2021 1131   BILITOT 0.5 07/04/2021 1131   GFRNONAA >60 07/04/2021 1131   GFRAA >60 01/07/2020 1119   GFRAA >60 12/31/2019 1230    No results found for: TOTALPROTELP, ALBUMINELP, A1GS, A2GS, BETS,  BETA2SER, GAMS, MSPIKE, SPEI  Lab Results  Component Value Date   WBC 8.5 07/04/2021   NEUTROABS 5.9 07/04/2021   HGB 13.4 07/04/2021   HCT 39.7 07/04/2021   MCV 100.5 (H) 07/04/2021   PLT 339 07/04/2021    No results found for: LABCA2  No components found for: [id number]  No results for input(s): INR in the last 168 hours.  No results found for: LABCA2  No results found for: FOY774  No results found for: JOI786  No results found for: VEH209  No results found for: CA2729  No components found for: HGQUANT  No results found for: CEA1 / No results found for: CEA1   No results found for: AFPTUMOR  No results found for: CHROMOGRNA  No results found for: KPAFRELGTCHN, LAMBDASER, KAPLAMBRATIO (kappa/lambda light chains)  No results found for: HGBA, HGBA2QUANT, HGBFQUANT, HGBSQUAN (Hemoglobinopathy evaluation)   No results found for: LDH  No results found for: IRON, TIBC, IRONPCTSAT (Iron and TIBC)  No results found for: FERRITIN  Urinalysis No results found for: COLORURINE, APPEARANCEUR, LABSPEC, PHURINE, GLUCOSEU, HGBUR, BILIRUBINUR, KETONESUR, PROTEINUR, UROBILINOGEN, NITRITE, LEUKOCYTESUR   STUDIES: No results found.   ELIGIBLE FOR AVAILABLE RESEARCH PROTOCOL: AET  ASSESSMENT: 75 y.o. [place] woman status post right breast upper outer quadrant biopsy 12/22/2019 for a clinical T1a N0, stage IA invasive ductal carcinoma, grade 1, estrogen and progesterone receptor positive, HER-2 not amplified, with an MIB-1 of 1%  (1) status post right lumpectomy 01/15/2020 for a pT1b pNX invasive ductal carcinoma, grade 1, with negative margins  (2) adjuvant radiation to the right partial breast at [facility] completed 04/14/2020 DOSE:  26 Gy in 5 fractions (under Lauree Chandler).  (3) started anastrozole 02/23/2020--tolerated very poorly  (a) patient has a remote history of pulmonary embolus and is not a candidate for tamoxifen  (b) switched to exemestane 03/22/2020, with  better tolerance.  (c) DEXA scan at [facility] 11/21/2019 showed a T score of -1.9   PLAN: Oneda is now a year and a half out from definitive surgery for her breast cancer with no evidence of disease recurrence.  This is very favorable.  She is tolerating exemestane well and the plan is to continue that a minimum of 5 years.  She is scheduled for  repeat DEXA scan at [facility] early in 2023 and depending on those results she may consider pharmacologic bone building therapy  She has a persistent nonprogressive elevation in the alkaline phosphatase which is commonly associated with fatty liver.  We discussed that at length today and I suggested she could minimize carbs in her diet and keep an eye on that particular lab in the future.  She is very satisfied with Dr. Derrill Center at [facility] and is planning to continue her care there.  Accordingly we are making no return appointment for her here but she knows we will be glad to see her again at any point in the future if and when the need arises  Total encounter time 25 minutes.Sarajane Jews C. Tiara Bartoli, MD 07/04/2021 12:37 PM Medical Oncology and Hematology [facility] [place], [place] [address] Tel. [phone]    Fax. [phone]   This document serves as a record of services personally performed by Lurline Del, MD. It was created on his behalf by Wilburn Mylar, a trained medical scribe. The creation of this record is based on the scribe's personal observations and the provider's statements to them.   I, Lurline Del MD, have reviewed the above documentation for accuracy and completeness, and I agree with the above.   *Total Encounter Time as defined by the Centers for Medicare and Medicaid Services includes, in addition to the face-to-face time of a patient visit (documented in the note above) non-face-to-face time: obtaining and reviewing outside history, ordering and reviewing medications, tests or procedures, care  coordination (communications with other health care professionals or caregivers) and documentation in the medical record.

## 2021-07-04 ENCOUNTER — Other Ambulatory Visit: Payer: Self-pay

## 2021-07-04 ENCOUNTER — Inpatient Hospital Stay: Payer: Medicare PPO | Attending: Oncology | Admitting: Oncology

## 2021-07-04 ENCOUNTER — Inpatient Hospital Stay: Payer: Medicare PPO

## 2021-07-04 VITALS — BP 128/68 | HR 67 | Temp 97.9°F | Resp 18 | Ht 66.0 in | Wt 171.8 lb

## 2021-07-04 DIAGNOSIS — Z79811 Long term (current) use of aromatase inhibitors: Secondary | ICD-10-CM | POA: Insufficient documentation

## 2021-07-04 DIAGNOSIS — Z79899 Other long term (current) drug therapy: Secondary | ICD-10-CM | POA: Diagnosis not present

## 2021-07-04 DIAGNOSIS — Z170 Estrogen receptor positive status [ER+]: Secondary | ICD-10-CM

## 2021-07-04 DIAGNOSIS — Z86711 Personal history of pulmonary embolism: Secondary | ICD-10-CM | POA: Diagnosis not present

## 2021-07-04 DIAGNOSIS — Z800 Family history of malignant neoplasm of digestive organs: Secondary | ICD-10-CM | POA: Insufficient documentation

## 2021-07-04 DIAGNOSIS — C50411 Malignant neoplasm of upper-outer quadrant of right female breast: Secondary | ICD-10-CM

## 2021-07-04 DIAGNOSIS — Z801 Family history of malignant neoplasm of trachea, bronchus and lung: Secondary | ICD-10-CM | POA: Diagnosis not present

## 2021-07-04 DIAGNOSIS — Z923 Personal history of irradiation: Secondary | ICD-10-CM | POA: Diagnosis not present

## 2021-07-04 DIAGNOSIS — N6315 Unspecified lump in the right breast, overlapping quadrants: Secondary | ICD-10-CM | POA: Insufficient documentation

## 2021-07-04 DIAGNOSIS — Z87891 Personal history of nicotine dependence: Secondary | ICD-10-CM | POA: Diagnosis not present

## 2021-07-04 LAB — CBC WITH DIFFERENTIAL (CANCER CENTER ONLY)
Abs Immature Granulocytes: 0.03 10*3/uL (ref 0.00–0.07)
Basophils Absolute: 0.1 10*3/uL (ref 0.0–0.1)
Basophils Relative: 1 %
Eosinophils Absolute: 0.1 10*3/uL (ref 0.0–0.5)
Eosinophils Relative: 1 %
HCT: 39.7 % (ref 36.0–46.0)
Hemoglobin: 13.4 g/dL (ref 12.0–15.0)
Immature Granulocytes: 0 %
Lymphocytes Relative: 20 %
Lymphs Abs: 1.7 10*3/uL (ref 0.7–4.0)
MCH: 33.9 pg (ref 26.0–34.0)
MCHC: 33.8 g/dL (ref 30.0–36.0)
MCV: 100.5 fL — ABNORMAL HIGH (ref 80.0–100.0)
Monocytes Absolute: 0.7 10*3/uL (ref 0.1–1.0)
Monocytes Relative: 8 %
Neutro Abs: 5.9 10*3/uL (ref 1.7–7.7)
Neutrophils Relative %: 70 %
Platelet Count: 339 10*3/uL (ref 150–400)
RBC: 3.95 MIL/uL (ref 3.87–5.11)
RDW: 10.9 % — ABNORMAL LOW (ref 11.5–15.5)
WBC Count: 8.5 10*3/uL (ref 4.0–10.5)
nRBC: 0 % (ref 0.0–0.2)

## 2021-07-04 LAB — CMP (CANCER CENTER ONLY)
ALT: 19 U/L (ref 0–44)
AST: 19 U/L (ref 15–41)
Albumin: 3.9 g/dL (ref 3.5–5.0)
Alkaline Phosphatase: 161 U/L — ABNORMAL HIGH (ref 38–126)
Anion gap: 10 (ref 5–15)
BUN: 12 mg/dL (ref 8–23)
CO2: 25 mmol/L (ref 22–32)
Calcium: 9.5 mg/dL (ref 8.9–10.3)
Chloride: 106 mmol/L (ref 98–111)
Creatinine: 0.84 mg/dL (ref 0.44–1.00)
GFR, Estimated: >60 mL/min (ref >60)
Glucose, Bld: 88 mg/dL (ref 70–99)
Potassium: 3.5 mmol/L (ref 3.5–5.1)
Sodium: 141 mmol/L (ref 135–145)
Total Bilirubin: 0.5 mg/dL (ref 0.3–1.2)
Total Protein: 7.2 g/dL (ref 6.5–8.1)

## 2021-07-04 MED ORDER — EXEMESTANE 25 MG PO TABS: 25.0000 mg | ORAL_TABLET | Freq: Every day | ORAL | 4 refills | Status: AC

## 2021-07-05 DIAGNOSIS — M85832 Other specified disorders of bone density and structure, left forearm: Secondary | ICD-10-CM | POA: Diagnosis not present

## 2021-07-05 DIAGNOSIS — C50411 Malignant neoplasm of upper-outer quadrant of right female breast: Secondary | ICD-10-CM | POA: Diagnosis not present

## 2021-07-05 DIAGNOSIS — Z170 Estrogen receptor positive status [ER+]: Secondary | ICD-10-CM | POA: Diagnosis not present

## 2021-07-05 DIAGNOSIS — M8588 Other specified disorders of bone density and structure, other site: Secondary | ICD-10-CM | POA: Diagnosis not present

## 2021-08-02 DIAGNOSIS — Z79811 Long term (current) use of aromatase inhibitors: Secondary | ICD-10-CM | POA: Diagnosis not present

## 2021-08-02 DIAGNOSIS — Z5181 Encounter for therapeutic drug level monitoring: Secondary | ICD-10-CM | POA: Diagnosis not present

## 2021-08-02 DIAGNOSIS — Z170 Estrogen receptor positive status [ER+]: Secondary | ICD-10-CM | POA: Diagnosis not present

## 2021-08-02 DIAGNOSIS — C50411 Malignant neoplasm of upper-outer quadrant of right female breast: Secondary | ICD-10-CM | POA: Diagnosis not present

## 2021-08-02 DIAGNOSIS — Z853 Personal history of malignant neoplasm of breast: Secondary | ICD-10-CM | POA: Diagnosis not present

## 2021-08-25 DIAGNOSIS — M8580 Other specified disorders of bone density and structure, unspecified site: Secondary | ICD-10-CM | POA: Diagnosis not present

## 2021-09-13 DIAGNOSIS — Z0000 Encounter for general adult medical examination without abnormal findings: Secondary | ICD-10-CM | POA: Diagnosis not present

## 2021-09-13 DIAGNOSIS — I10 Essential (primary) hypertension: Secondary | ICD-10-CM | POA: Diagnosis not present

## 2021-09-13 DIAGNOSIS — N182 Chronic kidney disease, stage 2 (mild): Secondary | ICD-10-CM | POA: Diagnosis not present

## 2021-09-13 DIAGNOSIS — E782 Mixed hyperlipidemia: Secondary | ICD-10-CM | POA: Diagnosis not present

## 2021-09-13 DIAGNOSIS — Z23 Encounter for immunization: Secondary | ICD-10-CM | POA: Diagnosis not present

## 2021-09-21 DIAGNOSIS — I10 Essential (primary) hypertension: Secondary | ICD-10-CM | POA: Diagnosis not present

## 2021-09-21 DIAGNOSIS — E782 Mixed hyperlipidemia: Secondary | ICD-10-CM | POA: Diagnosis not present

## 2021-09-21 DIAGNOSIS — M150 Primary generalized (osteo)arthritis: Secondary | ICD-10-CM | POA: Diagnosis not present

## 2021-09-21 DIAGNOSIS — N182 Chronic kidney disease, stage 2 (mild): Secondary | ICD-10-CM | POA: Diagnosis not present

## 2021-09-21 DIAGNOSIS — M810 Age-related osteoporosis without current pathological fracture: Secondary | ICD-10-CM | POA: Diagnosis not present

## 2021-09-21 DIAGNOSIS — Z0000 Encounter for general adult medical examination without abnormal findings: Secondary | ICD-10-CM | POA: Diagnosis not present

## 2021-12-23 DIAGNOSIS — C50411 Malignant neoplasm of upper-outer quadrant of right female breast: Secondary | ICD-10-CM | POA: Diagnosis not present

## 2021-12-23 DIAGNOSIS — N6489 Other specified disorders of breast: Secondary | ICD-10-CM | POA: Diagnosis not present

## 2021-12-23 DIAGNOSIS — Z170 Estrogen receptor positive status [ER+]: Secondary | ICD-10-CM | POA: Diagnosis not present

## 2022-01-30 DIAGNOSIS — Z853 Personal history of malignant neoplasm of breast: Secondary | ICD-10-CM | POA: Diagnosis not present

## 2022-01-30 DIAGNOSIS — I10 Essential (primary) hypertension: Secondary | ICD-10-CM | POA: Diagnosis not present

## 2022-01-30 DIAGNOSIS — Z01419 Encounter for gynecological examination (general) (routine) without abnormal findings: Secondary | ICD-10-CM | POA: Diagnosis not present

## 2022-01-30 DIAGNOSIS — Z780 Asymptomatic menopausal state: Secondary | ICD-10-CM | POA: Diagnosis not present

## 2022-01-30 DIAGNOSIS — M810 Age-related osteoporosis without current pathological fracture: Secondary | ICD-10-CM | POA: Diagnosis not present

## 2022-01-31 DIAGNOSIS — M8580 Other specified disorders of bone density and structure, unspecified site: Secondary | ICD-10-CM | POA: Diagnosis not present

## 2022-01-31 DIAGNOSIS — C50411 Malignant neoplasm of upper-outer quadrant of right female breast: Secondary | ICD-10-CM | POA: Diagnosis not present

## 2022-01-31 DIAGNOSIS — R232 Flushing: Secondary | ICD-10-CM | POA: Diagnosis not present

## 2022-01-31 DIAGNOSIS — Z79899 Other long term (current) drug therapy: Secondary | ICD-10-CM | POA: Diagnosis not present

## 2022-01-31 DIAGNOSIS — Z853 Personal history of malignant neoplasm of breast: Secondary | ICD-10-CM | POA: Diagnosis not present

## 2022-01-31 DIAGNOSIS — Z08 Encounter for follow-up examination after completed treatment for malignant neoplasm: Secondary | ICD-10-CM | POA: Diagnosis not present

## 2022-02-08 DIAGNOSIS — M5442 Lumbago with sciatica, left side: Secondary | ICD-10-CM | POA: Diagnosis not present

## 2022-02-28 DIAGNOSIS — M5442 Lumbago with sciatica, left side: Secondary | ICD-10-CM | POA: Diagnosis not present

## 2022-03-08 DIAGNOSIS — M79605 Pain in left leg: Secondary | ICD-10-CM | POA: Diagnosis not present

## 2022-03-08 DIAGNOSIS — M79604 Pain in right leg: Secondary | ICD-10-CM | POA: Diagnosis not present

## 2022-03-08 DIAGNOSIS — R262 Difficulty in walking, not elsewhere classified: Secondary | ICD-10-CM | POA: Diagnosis not present

## 2022-03-16 DIAGNOSIS — M79605 Pain in left leg: Secondary | ICD-10-CM | POA: Diagnosis not present

## 2022-03-16 DIAGNOSIS — M79604 Pain in right leg: Secondary | ICD-10-CM | POA: Diagnosis not present

## 2022-03-16 DIAGNOSIS — R262 Difficulty in walking, not elsewhere classified: Secondary | ICD-10-CM | POA: Diagnosis not present

## 2022-03-20 DIAGNOSIS — R262 Difficulty in walking, not elsewhere classified: Secondary | ICD-10-CM | POA: Diagnosis not present

## 2022-03-20 DIAGNOSIS — M79605 Pain in left leg: Secondary | ICD-10-CM | POA: Diagnosis not present

## 2022-03-20 DIAGNOSIS — M79604 Pain in right leg: Secondary | ICD-10-CM | POA: Diagnosis not present

## 2022-03-23 DIAGNOSIS — M79604 Pain in right leg: Secondary | ICD-10-CM | POA: Diagnosis not present

## 2022-03-23 DIAGNOSIS — R262 Difficulty in walking, not elsewhere classified: Secondary | ICD-10-CM | POA: Diagnosis not present

## 2022-03-23 DIAGNOSIS — M79605 Pain in left leg: Secondary | ICD-10-CM | POA: Diagnosis not present

## 2022-03-27 DIAGNOSIS — M79605 Pain in left leg: Secondary | ICD-10-CM | POA: Diagnosis not present

## 2022-03-27 DIAGNOSIS — M79604 Pain in right leg: Secondary | ICD-10-CM | POA: Diagnosis not present

## 2022-03-27 DIAGNOSIS — R262 Difficulty in walking, not elsewhere classified: Secondary | ICD-10-CM | POA: Diagnosis not present

## 2022-04-03 DIAGNOSIS — M79604 Pain in right leg: Secondary | ICD-10-CM | POA: Diagnosis not present

## 2022-04-03 DIAGNOSIS — M79605 Pain in left leg: Secondary | ICD-10-CM | POA: Diagnosis not present

## 2022-04-03 DIAGNOSIS — R262 Difficulty in walking, not elsewhere classified: Secondary | ICD-10-CM | POA: Diagnosis not present

## 2022-04-05 DIAGNOSIS — R262 Difficulty in walking, not elsewhere classified: Secondary | ICD-10-CM | POA: Diagnosis not present

## 2022-04-05 DIAGNOSIS — M79605 Pain in left leg: Secondary | ICD-10-CM | POA: Diagnosis not present

## 2022-04-05 DIAGNOSIS — M79604 Pain in right leg: Secondary | ICD-10-CM | POA: Diagnosis not present

## 2022-04-10 DIAGNOSIS — M79604 Pain in right leg: Secondary | ICD-10-CM | POA: Diagnosis not present

## 2022-04-10 DIAGNOSIS — M79605 Pain in left leg: Secondary | ICD-10-CM | POA: Diagnosis not present

## 2022-04-10 DIAGNOSIS — R262 Difficulty in walking, not elsewhere classified: Secondary | ICD-10-CM | POA: Diagnosis not present

## 2022-04-28 DIAGNOSIS — M5116 Intervertebral disc disorders with radiculopathy, lumbar region: Secondary | ICD-10-CM | POA: Diagnosis not present

## 2022-04-28 DIAGNOSIS — M5442 Lumbago with sciatica, left side: Secondary | ICD-10-CM | POA: Diagnosis not present

## 2022-04-28 DIAGNOSIS — M5441 Lumbago with sciatica, right side: Secondary | ICD-10-CM | POA: Diagnosis not present

## 2022-05-16 DIAGNOSIS — F40240 Claustrophobia: Secondary | ICD-10-CM | POA: Diagnosis not present

## 2022-05-16 DIAGNOSIS — M5416 Radiculopathy, lumbar region: Secondary | ICD-10-CM | POA: Diagnosis not present

## 2022-05-16 DIAGNOSIS — M479 Spondylosis, unspecified: Secondary | ICD-10-CM | POA: Diagnosis not present

## 2022-05-28 DIAGNOSIS — M5416 Radiculopathy, lumbar region: Secondary | ICD-10-CM | POA: Diagnosis not present

## 2022-06-02 DIAGNOSIS — M5416 Radiculopathy, lumbar region: Secondary | ICD-10-CM | POA: Diagnosis not present

## 2022-06-02 DIAGNOSIS — M7138 Other bursal cyst, other site: Secondary | ICD-10-CM | POA: Diagnosis not present

## 2022-06-02 DIAGNOSIS — M479 Spondylosis, unspecified: Secondary | ICD-10-CM | POA: Diagnosis not present

## 2022-06-05 DIAGNOSIS — M5416 Radiculopathy, lumbar region: Secondary | ICD-10-CM | POA: Diagnosis not present

## 2022-06-05 DIAGNOSIS — M5451 Vertebrogenic low back pain: Secondary | ICD-10-CM | POA: Diagnosis not present

## 2022-06-16 DIAGNOSIS — M5416 Radiculopathy, lumbar region: Secondary | ICD-10-CM | POA: Diagnosis not present

## 2022-06-16 DIAGNOSIS — M5451 Vertebrogenic low back pain: Secondary | ICD-10-CM | POA: Diagnosis not present

## 2022-06-20 DIAGNOSIS — M5416 Radiculopathy, lumbar region: Secondary | ICD-10-CM | POA: Diagnosis not present

## 2022-06-20 DIAGNOSIS — M5451 Vertebrogenic low back pain: Secondary | ICD-10-CM | POA: Diagnosis not present

## 2022-06-22 DIAGNOSIS — M5416 Radiculopathy, lumbar region: Secondary | ICD-10-CM | POA: Diagnosis not present

## 2022-06-22 DIAGNOSIS — M5451 Vertebrogenic low back pain: Secondary | ICD-10-CM | POA: Diagnosis not present

## 2022-06-27 DIAGNOSIS — M5451 Vertebrogenic low back pain: Secondary | ICD-10-CM | POA: Diagnosis not present

## 2022-06-27 DIAGNOSIS — M5416 Radiculopathy, lumbar region: Secondary | ICD-10-CM | POA: Diagnosis not present

## 2022-06-30 DIAGNOSIS — M5451 Vertebrogenic low back pain: Secondary | ICD-10-CM | POA: Diagnosis not present

## 2022-06-30 DIAGNOSIS — M5416 Radiculopathy, lumbar region: Secondary | ICD-10-CM | POA: Diagnosis not present

## 2022-07-06 DIAGNOSIS — M5451 Vertebrogenic low back pain: Secondary | ICD-10-CM | POA: Diagnosis not present

## 2022-07-06 DIAGNOSIS — M5416 Radiculopathy, lumbar region: Secondary | ICD-10-CM | POA: Diagnosis not present

## 2022-08-03 DIAGNOSIS — M8580 Other specified disorders of bone density and structure, unspecified site: Secondary | ICD-10-CM | POA: Diagnosis not present

## 2022-08-03 DIAGNOSIS — C50411 Malignant neoplasm of upper-outer quadrant of right female breast: Secondary | ICD-10-CM | POA: Diagnosis not present

## 2022-08-03 DIAGNOSIS — Z170 Estrogen receptor positive status [ER+]: Secondary | ICD-10-CM | POA: Diagnosis not present

## 2022-08-03 DIAGNOSIS — Z5181 Encounter for therapeutic drug level monitoring: Secondary | ICD-10-CM | POA: Diagnosis not present

## 2022-08-03 DIAGNOSIS — Z9189 Other specified personal risk factors, not elsewhere classified: Secondary | ICD-10-CM | POA: Diagnosis not present

## 2022-08-03 DIAGNOSIS — Z79811 Long term (current) use of aromatase inhibitors: Secondary | ICD-10-CM | POA: Diagnosis not present

## 2022-09-01 DIAGNOSIS — R748 Abnormal levels of other serum enzymes: Secondary | ICD-10-CM | POA: Diagnosis not present

## 2022-10-04 DIAGNOSIS — Z23 Encounter for immunization: Secondary | ICD-10-CM | POA: Diagnosis not present

## 2022-10-04 DIAGNOSIS — N182 Chronic kidney disease, stage 2 (mild): Secondary | ICD-10-CM | POA: Diagnosis not present

## 2022-10-04 DIAGNOSIS — E782 Mixed hyperlipidemia: Secondary | ICD-10-CM | POA: Diagnosis not present

## 2022-10-04 DIAGNOSIS — I10 Essential (primary) hypertension: Secondary | ICD-10-CM | POA: Diagnosis not present

## 2022-10-04 DIAGNOSIS — M150 Primary generalized (osteo)arthritis: Secondary | ICD-10-CM | POA: Diagnosis not present

## 2022-10-04 DIAGNOSIS — M810 Age-related osteoporosis without current pathological fracture: Secondary | ICD-10-CM | POA: Diagnosis not present

## 2022-10-04 DIAGNOSIS — Z0000 Encounter for general adult medical examination without abnormal findings: Secondary | ICD-10-CM | POA: Diagnosis not present

## 2022-10-11 DIAGNOSIS — I10 Essential (primary) hypertension: Secondary | ICD-10-CM | POA: Diagnosis not present

## 2022-10-11 DIAGNOSIS — E782 Mixed hyperlipidemia: Secondary | ICD-10-CM | POA: Diagnosis not present

## 2022-10-11 DIAGNOSIS — Z0000 Encounter for general adult medical examination without abnormal findings: Secondary | ICD-10-CM | POA: Diagnosis not present

## 2022-10-11 DIAGNOSIS — M810 Age-related osteoporosis without current pathological fracture: Secondary | ICD-10-CM | POA: Diagnosis not present

## 2022-10-11 DIAGNOSIS — M150 Primary generalized (osteo)arthritis: Secondary | ICD-10-CM | POA: Diagnosis not present

## 2022-10-11 DIAGNOSIS — R319 Hematuria, unspecified: Secondary | ICD-10-CM | POA: Diagnosis not present

## 2022-10-11 DIAGNOSIS — N182 Chronic kidney disease, stage 2 (mild): Secondary | ICD-10-CM | POA: Diagnosis not present

## 2022-11-10 DIAGNOSIS — R319 Hematuria, unspecified: Secondary | ICD-10-CM | POA: Diagnosis not present

## 2022-11-10 DIAGNOSIS — R3129 Other microscopic hematuria: Secondary | ICD-10-CM | POA: Diagnosis not present

## 2022-12-19 DIAGNOSIS — R3129 Other microscopic hematuria: Secondary | ICD-10-CM | POA: Diagnosis not present

## 2022-12-19 DIAGNOSIS — R319 Hematuria, unspecified: Secondary | ICD-10-CM | POA: Diagnosis not present

## 2022-12-21 DIAGNOSIS — R3129 Other microscopic hematuria: Secondary | ICD-10-CM | POA: Diagnosis not present

## 2023-01-31 DIAGNOSIS — C50411 Malignant neoplasm of upper-outer quadrant of right female breast: Secondary | ICD-10-CM | POA: Diagnosis not present

## 2023-01-31 DIAGNOSIS — Z79811 Long term (current) use of aromatase inhibitors: Secondary | ICD-10-CM | POA: Diagnosis not present

## 2023-01-31 DIAGNOSIS — R748 Abnormal levels of other serum enzymes: Secondary | ICD-10-CM | POA: Diagnosis not present

## 2023-01-31 DIAGNOSIS — Z79899 Other long term (current) drug therapy: Secondary | ICD-10-CM | POA: Diagnosis not present

## 2023-01-31 DIAGNOSIS — Z5181 Encounter for therapeutic drug level monitoring: Secondary | ICD-10-CM | POA: Diagnosis not present

## 2023-01-31 DIAGNOSIS — Z170 Estrogen receptor positive status [ER+]: Secondary | ICD-10-CM | POA: Diagnosis not present

## 2023-01-31 DIAGNOSIS — Z7182 Exercise counseling: Secondary | ICD-10-CM | POA: Diagnosis not present

## 2023-01-31 DIAGNOSIS — M8580 Other specified disorders of bone density and structure, unspecified site: Secondary | ICD-10-CM | POA: Diagnosis not present

## 2023-02-06 DIAGNOSIS — A6000 Herpesviral infection of urogenital system, unspecified: Secondary | ICD-10-CM | POA: Diagnosis not present

## 2023-02-06 DIAGNOSIS — I10 Essential (primary) hypertension: Secondary | ICD-10-CM | POA: Diagnosis not present

## 2023-02-06 DIAGNOSIS — Z01419 Encounter for gynecological examination (general) (routine) without abnormal findings: Secondary | ICD-10-CM | POA: Diagnosis not present

## 2023-02-06 DIAGNOSIS — M8580 Other specified disorders of bone density and structure, unspecified site: Secondary | ICD-10-CM | POA: Diagnosis not present

## 2023-02-06 DIAGNOSIS — Z780 Asymptomatic menopausal state: Secondary | ICD-10-CM | POA: Diagnosis not present

## 2023-02-06 DIAGNOSIS — E785 Hyperlipidemia, unspecified: Secondary | ICD-10-CM | POA: Diagnosis not present

## 2023-02-06 DIAGNOSIS — Z853 Personal history of malignant neoplasm of breast: Secondary | ICD-10-CM | POA: Diagnosis not present

## 2023-02-14 DIAGNOSIS — H25812 Combined forms of age-related cataract, left eye: Secondary | ICD-10-CM | POA: Diagnosis not present

## 2023-02-14 DIAGNOSIS — H2511 Age-related nuclear cataract, right eye: Secondary | ICD-10-CM | POA: Diagnosis not present

## 2023-04-05 DIAGNOSIS — H2512 Age-related nuclear cataract, left eye: Secondary | ICD-10-CM | POA: Diagnosis not present

## 2023-04-19 DIAGNOSIS — H2511 Age-related nuclear cataract, right eye: Secondary | ICD-10-CM | POA: Diagnosis not present

## 2023-08-06 DIAGNOSIS — C50411 Malignant neoplasm of upper-outer quadrant of right female breast: Secondary | ICD-10-CM | POA: Diagnosis not present

## 2023-08-06 DIAGNOSIS — Z13820 Encounter for screening for osteoporosis: Secondary | ICD-10-CM | POA: Diagnosis not present

## 2023-08-06 DIAGNOSIS — Z853 Personal history of malignant neoplasm of breast: Secondary | ICD-10-CM | POA: Diagnosis not present

## 2023-08-06 DIAGNOSIS — Z5181 Encounter for therapeutic drug level monitoring: Secondary | ICD-10-CM | POA: Diagnosis not present

## 2023-08-06 DIAGNOSIS — M8580 Other specified disorders of bone density and structure, unspecified site: Secondary | ICD-10-CM | POA: Diagnosis not present

## 2023-08-06 DIAGNOSIS — Z170 Estrogen receptor positive status [ER+]: Secondary | ICD-10-CM | POA: Diagnosis not present

## 2023-08-06 DIAGNOSIS — Z79811 Long term (current) use of aromatase inhibitors: Secondary | ICD-10-CM | POA: Diagnosis not present

## 2023-10-01 DIAGNOSIS — H538 Other visual disturbances: Secondary | ICD-10-CM | POA: Diagnosis not present

## 2023-10-01 DIAGNOSIS — Z961 Presence of intraocular lens: Secondary | ICD-10-CM | POA: Diagnosis not present

## 2023-10-10 DIAGNOSIS — N182 Chronic kidney disease, stage 2 (mild): Secondary | ICD-10-CM | POA: Diagnosis not present

## 2023-10-10 DIAGNOSIS — E782 Mixed hyperlipidemia: Secondary | ICD-10-CM | POA: Diagnosis not present

## 2023-10-10 DIAGNOSIS — Z0000 Encounter for general adult medical examination without abnormal findings: Secondary | ICD-10-CM | POA: Diagnosis not present

## 2023-10-10 DIAGNOSIS — R319 Hematuria, unspecified: Secondary | ICD-10-CM | POA: Diagnosis not present

## 2023-10-10 DIAGNOSIS — I10 Essential (primary) hypertension: Secondary | ICD-10-CM | POA: Diagnosis not present

## 2023-10-17 DIAGNOSIS — R311 Benign essential microscopic hematuria: Secondary | ICD-10-CM | POA: Diagnosis not present

## 2023-10-17 DIAGNOSIS — Z0000 Encounter for general adult medical examination without abnormal findings: Secondary | ICD-10-CM | POA: Diagnosis not present

## 2023-10-17 DIAGNOSIS — M150 Primary generalized (osteo)arthritis: Secondary | ICD-10-CM | POA: Diagnosis not present

## 2023-10-17 DIAGNOSIS — M810 Age-related osteoporosis without current pathological fracture: Secondary | ICD-10-CM | POA: Diagnosis not present

## 2023-10-17 DIAGNOSIS — N182 Chronic kidney disease, stage 2 (mild): Secondary | ICD-10-CM | POA: Diagnosis not present

## 2023-10-17 DIAGNOSIS — I10 Essential (primary) hypertension: Secondary | ICD-10-CM | POA: Diagnosis not present

## 2023-10-17 DIAGNOSIS — E782 Mixed hyperlipidemia: Secondary | ICD-10-CM | POA: Diagnosis not present

## 2023-12-24 DIAGNOSIS — Z411 Encounter for cosmetic surgery: Secondary | ICD-10-CM | POA: Diagnosis not present

## 2023-12-24 DIAGNOSIS — L723 Sebaceous cyst: Secondary | ICD-10-CM | POA: Diagnosis not present

## 2024-02-04 DIAGNOSIS — M8588 Other specified disorders of bone density and structure, other site: Secondary | ICD-10-CM | POA: Diagnosis not present

## 2024-02-04 DIAGNOSIS — R748 Abnormal levels of other serum enzymes: Secondary | ICD-10-CM | POA: Diagnosis not present

## 2024-02-04 DIAGNOSIS — Z79811 Long term (current) use of aromatase inhibitors: Secondary | ICD-10-CM | POA: Diagnosis not present

## 2024-02-04 DIAGNOSIS — Z170 Estrogen receptor positive status [ER+]: Secondary | ICD-10-CM | POA: Diagnosis not present

## 2024-02-04 DIAGNOSIS — C50411 Malignant neoplasm of upper-outer quadrant of right female breast: Secondary | ICD-10-CM | POA: Diagnosis not present

## 2024-02-04 DIAGNOSIS — R928 Other abnormal and inconclusive findings on diagnostic imaging of breast: Secondary | ICD-10-CM | POA: Diagnosis not present

## 2024-02-04 DIAGNOSIS — Z7182 Exercise counseling: Secondary | ICD-10-CM | POA: Diagnosis not present

## 2024-02-04 DIAGNOSIS — M8580 Other specified disorders of bone density and structure, unspecified site: Secondary | ICD-10-CM | POA: Diagnosis not present

## 2024-02-11 DIAGNOSIS — I10 Essential (primary) hypertension: Secondary | ICD-10-CM | POA: Diagnosis not present

## 2024-02-11 DIAGNOSIS — Z853 Personal history of malignant neoplasm of breast: Secondary | ICD-10-CM | POA: Diagnosis not present

## 2024-02-11 DIAGNOSIS — M8580 Other specified disorders of bone density and structure, unspecified site: Secondary | ICD-10-CM | POA: Diagnosis not present

## 2024-02-11 DIAGNOSIS — Z01419 Encounter for gynecological examination (general) (routine) without abnormal findings: Secondary | ICD-10-CM | POA: Diagnosis not present

## 2024-02-11 DIAGNOSIS — Z780 Asymptomatic menopausal state: Secondary | ICD-10-CM | POA: Diagnosis not present
